# Patient Record
Sex: Male | Born: 1955 | Race: White | Hispanic: No | Marital: Married | Smoking: Never smoker
Health system: Southern US, Community
[De-identification: ages and names within clinical notes are randomized; demographics above are authoritative.]

---

## 2006-12-19 ENCOUNTER — Encounter (INDEPENDENT_AMBULATORY_CARE_PROVIDER_SITE_OTHER): Payer: Self-pay | Admitting: Internal Medicine

## 2006-12-19 ENCOUNTER — Ambulatory Visit: Payer: Self-pay | Admitting: Family Medicine

## 2006-12-22 ENCOUNTER — Ambulatory Visit: Payer: Self-pay | Admitting: Internal Medicine

## 2006-12-26 ENCOUNTER — Ambulatory Visit: Payer: Self-pay | Admitting: Internal Medicine

## 2007-02-15 ENCOUNTER — Ambulatory Visit: Payer: Self-pay | Admitting: Family Medicine

## 2008-01-18 ENCOUNTER — Ambulatory Visit: Payer: Self-pay | Admitting: Cardiology

## 2008-01-18 ENCOUNTER — Encounter (INDEPENDENT_AMBULATORY_CARE_PROVIDER_SITE_OTHER): Payer: Self-pay | Admitting: Internal Medicine

## 2008-01-18 ENCOUNTER — Emergency Department (HOSPITAL_COMMUNITY): Admission: EM | Admit: 2008-01-18 | Discharge: 2008-01-18 | Payer: Self-pay | Admitting: Emergency Medicine

## 2008-01-18 ENCOUNTER — Ambulatory Visit: Payer: Self-pay | Admitting: Family Medicine

## 2008-01-18 DIAGNOSIS — F528 Other sexual dysfunction not due to a substance or known physiological condition: Secondary | ICD-10-CM

## 2008-01-18 DIAGNOSIS — R079 Chest pain, unspecified: Secondary | ICD-10-CM

## 2008-01-18 DIAGNOSIS — E162 Hypoglycemia, unspecified: Secondary | ICD-10-CM

## 2008-01-18 DIAGNOSIS — E782 Mixed hyperlipidemia: Secondary | ICD-10-CM | POA: Insufficient documentation

## 2010-07-20 ENCOUNTER — Encounter (INDEPENDENT_AMBULATORY_CARE_PROVIDER_SITE_OTHER): Payer: Self-pay | Admitting: *Deleted

## 2011-01-12 NOTE — Letter (Signed)
Summary: Nadara Eaton letter  [facility]  [address] [place], [place] [address]   Phone: [phone]  Fax: [phone]       07/20/2010 MRN: [id number]  [facility] [address] [place], [place]  [address]  Dear Mr. Kolenovic,  [facility] [place] [place] announce the retirement of Arta Silence, M.D., from full-time practice at the [facility] office effective June 11, 2010 and his plans of returning part-time.  It is important to Dr. Hetty Ely and to our practice that you understand that [facility] has seven physicians in our office for your health care needs.  We will continue to offer the same exceptional care that you have today.    Dr. Hetty Ely has spoken to many of you about his plans for retirement and returning part-time in the fall.   We will continue to work with you through the transition to schedule appointments for you in the office and meet the high standards that [facility] is committed to.   Again, it is with great pleasure that we share the news that Dr. Hetty Ely will return to [facility] in October of 2011 with a reduced schedule.    If you have any questions, or would like to request an appointment with one of our physicians, please call us at [phone] and press the option for Scheduling an appointment.  We take pleasure in providing you with excellent patient care and look forward to seeing you at your next office visit.  Our [facility] Physicians are:  Tillman Abide, M.D. Laurita Quint, M.D. Roxy Manns, M.D. Kerby Nora, M.D. Hannah Beat, M.D. Ruthe Mannan, M.D. We proudly welcomed Raechel Ache, M.D. and Eustaquio Boyden, M.D. to the practice in July/August 2011.  Sincerely,  [facility]

## 2011-04-27 NOTE — Consult Note (Signed)
Jerry Daniels, Jerry Daniels              ACCOUNT NO.:  [id number]   MEDICAL RECORD NO.:  [id number]          PATIENT TYPE:  EMS   LOCATION:  [facility]                         FACILITY:  [facility]   PHYSICIAN:  Luis Abed, MD, FACCDATE OF BIRTH:  [birth year]   DATE OF CONSULTATION:  01/18/2008  DATE OF DISCHARGE:                                 CONSULTATION   PRIORITY EMERGENCY ROOM CONSULTATION   Mr. Carico is a very pleasant gentleman, who has had some marked  dizziness today.  He has no known prior cardiac disease.  He is not on  any medications.  He saw Everrett Coombe, Nurse Practitioner, at [facility] today and she called for [place] to help with further evaluation.  The  patient mentions that he had some slight chest discomfort that was in  his chest and into his back briefly on one or two occasions in the past  several weeks.  These have occurred at rest.  Independent of this, today  he felt dizziness.  This bothered him with any type of motion.  He also  had a sensation of flushing slightly with this and he looked slightly  diaphoretic and felt a little clammy.  He was not unstable at any point.  This constellation of symptoms was concerning and he was brought from  the [facility] office to the emergency room at [facility].  Here at  first, he did have dizziness.  This then began to improve.  Ultimately,  he was feeling better.  I also gave him 25 mg of Meclizine and he  continued to improve.   It is of note that his blood pressure is mildly elevated.  It is also of  note that his glucose was checked during the day today.  It was reported  that it was 29 when checked with his father's glucose meter.  Other  family members were then checked and were said to be in the normal  range.  It is still difficult to document for sure whether he was truly  hypoglycemic.   PAST MEDICAL HISTORY:   ALLERGIES:  No known drug allergies.   MEDICATIONS:  On no medications.   OTHER MEDICAL PROBLEMS:   See the list below.   SOCIAL HISTORY:  The patient is married.  He smoked for 30 years and  then stopped five years ago.  He lives in [place].  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   FAMILY HISTORY:  There is a family history of coronary disease.   REVIEW OF SYSTEMS:  As mentioned, he had some dizziness and sweats.  He  had no major headache.  He has had no skin rashes.  He did have slight  chest discomfort on a few occasions at rest.  He has had no major GI or  GU symptoms.  Otherwise, his review of systems is negative.   PHYSICAL EXAMINATION:  GENERAL:  The patient is here with his wife.  He  is oriented to person, time, and place.  Affect is normal.  VITAL SIGNS:  His blood pressure is 149/90, respirations  18, pulse is  60.  O2 SAT is 98% on room air.  HEENT:  No xanthelasma.  He has normal extraocular motion.  NECK:  There are no carotid bruits.  There is no jugulovenous  distention.  LUNGS:  Clear.  Respiratory effort is not labored.  CARDIAC:  An S1 with an S2.  There are no clicks or significant murmurs.  ABDOMEN:  Soft.  He has no masses or bruits.  He has normal bowel  sounds.  EXTREMITIES:  There is no significant peripheral edema.   EKG reveals sinus bradycardia.  Labs done in the emergency room reveal a  hemoglobin of 16.7 with a white count 6700 and platelets 305,000.  BUN  is 16 with a creatinine 1.13.  Glucose is 99 and potassium is 4.4.  Troponin is less than 0.01 and a point-of-care troponin is also less  than 0.05.  Calcium is normal.   PROBLEM LIST:  1. Borderline hypertension.  This will need to be followed up.  2. Question of a glucose of 29 today.  This is not completely      documented but will need to be kept in mind.  3. Some vague chest discomfort in the past few weeks.  I will arrange      for followup with me concerning outpatient Myoview scanning.  4. Episode today of marked dizziness, which now appears to be      improving.  I  suspect that this is some type of vertigo.  He seems      better with Meclizine.  His labs are stable.  He is being allowed      to go home with his wife driving, and he will take it easy tomorrow      and take more Meclizine during the day tomorrow.  I will be in      contact with Billie Bean to arrange to see the patient in followup      to review the issue as to whether or not he in fact has some      unusual type of reactive hypoglycemia, but I doubt that this is the      case.  His cardiac status is stable, and he is being discharged      from the emergency room.      Luis Abed, MD, [facility]  Electronically Signed     JDK/MEDQ  D:  01/18/2008  T:  01/18/2008  Job:  [id number]   cc:   Willaim Sheng D. Jillyn Hidden, FNP  Luis Abed, MD, [facility]

## 2011-09-03 LAB — POCT CARDIAC MARKERS
Myoglobin, poc: 40.8
Operator id: 146091
Troponin i, poc: <0.05

## 2011-09-03 LAB — DIFFERENTIAL
Basophils Absolute: 0
Basophils Relative: 1
Eosinophils Relative: 5
Lymphocytes Relative: 27
Neutro Abs: 4

## 2011-09-03 LAB — BASIC METABOLIC PANEL
Calcium: 9.3
Creatinine, Ser: 1.13
GFR calc Af Amer: >60
GFR calc non Af Amer: >60
Sodium: 140

## 2011-09-03 LAB — CK TOTAL AND CKMB (NOT AT ARMC)
CK, MB: 2.1
Relative Index: 1.5
Total CK: 144

## 2011-09-03 LAB — CBC
Hemoglobin: 16.7
RBC: 5.32
RDW: 13.2

## 2013-06-20 ENCOUNTER — Other Ambulatory Visit: Payer: Self-pay | Admitting: Nurse Practitioner

## 2013-06-20 LAB — CK-MB: CK-MB: 0.9 ng/mL (ref 0.5–3.6)

## 2014-05-31 ENCOUNTER — Ambulatory Visit: Payer: Self-pay | Admitting: Family Medicine

## 2015-02-04 ENCOUNTER — Ambulatory Visit: Payer: Self-pay | Admitting: Gastroenterology

## 2015-04-07 LAB — SURGICAL PATHOLOGY

## 2020-08-25 ENCOUNTER — Encounter: Payer: Self-pay | Admitting: Emergency Medicine

## 2020-08-25 ENCOUNTER — Emergency Department: Payer: 59

## 2020-08-25 ENCOUNTER — Emergency Department
Admission: EM | Admit: 2020-08-25 | Discharge: 2020-08-25 | Disposition: A | Discharge status: Home / Self Care | Payer: 59 | Attending: Emergency Medicine | Admitting: Emergency Medicine

## 2020-08-25 ENCOUNTER — Other Ambulatory Visit: Payer: Self-pay

## 2020-08-25 DIAGNOSIS — W19XXXA Unspecified fall, initial encounter: Secondary | ICD-10-CM

## 2020-08-25 DIAGNOSIS — S0101XA Laceration without foreign body of scalp, initial encounter: Secondary | ICD-10-CM | POA: Insufficient documentation

## 2020-08-25 DIAGNOSIS — W01198A Fall on same level from slipping, tripping and stumbling with subsequent striking against other object, initial encounter: Secondary | ICD-10-CM | POA: Diagnosis not present

## 2020-08-25 DIAGNOSIS — S01511A Laceration without foreign body of lip, initial encounter: Secondary | ICD-10-CM | POA: Diagnosis not present

## 2020-08-25 DIAGNOSIS — Y999 Unspecified external cause status: Secondary | ICD-10-CM | POA: Diagnosis not present

## 2020-08-25 DIAGNOSIS — Y939 Activity, unspecified: Secondary | ICD-10-CM | POA: Diagnosis not present

## 2020-08-25 DIAGNOSIS — Y929 Unspecified place or not applicable: Secondary | ICD-10-CM | POA: Diagnosis not present

## 2020-08-25 MED ORDER — LIDOCAINE-EPINEPHRINE 2 %-1:100000 IJ SOLN
20.0000 mL | Freq: Once | INTRAMUSCULAR | Status: AC
  Administered 2020-08-25: 20 mL
  Filled 2020-08-25: qty 1

## 2020-08-25 MED ORDER — OXYCODONE-ACETAMINOPHEN 5-325 MG PO TABS
1.0000 | ORAL_TABLET | Freq: Once | ORAL | Status: AC
  Administered 2020-08-25: 1 via ORAL
  Filled 2020-08-25: qty 1

## 2020-08-25 NOTE — ED Notes (Signed)
EDP at bedside to perform laceration repair.

## 2020-08-25 NOTE — ED Notes (Signed)
Pt's family updated multiple times over last hour by various staff.

## 2020-08-25 NOTE — ED Notes (Signed)
NAD noted at time of D/C. Pt taken to lobby via wheelchair at this time by his daughter. Pt denies comments/concerns regarding D/C instructions. Bandage applied by this RN prior to discharge.

## 2020-08-25 NOTE — ED Provider Notes (Signed)
[facility] Emergency Department Provider Note   ____________________________________________   First MD Initiated Contact with Patient 08/25/20 [phone]     (approximate)  I have reviewed the triage vital signs and the nursing notes.   HISTORY  Chief Complaint Fall    HPI Jerry Daniels is a 64 y.o. male with past medical history of hyperlipidemia who presents to the ED following fall.  Patient reports that he turned around while on the stairs and lost his balance, causing him to fall down 15-20 steps and strike his head on the railing.  He denies losing consciousness, but has had significant headache and neck pain since the fall.  He has large L-shaped laceration to his frontal scalp as well has laceration to his lower lip.  He denies any vision changes, speech changes, numbness or weakness.  He is not anticoagulated.  He denies any chest pain, abdominal pain, or extremity pain.        History reviewed. No pertinent past medical history.  Patient Active Problem List   Diagnosis Date Noted  . HYPOGLYCEMIA 01/18/2008  . 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 ERECTILE DYSFUNCTION 01/18/2008  . CHEST PAIN 01/18/2008    History reviewed. No pertinent surgical history.  Prior to Admission medications   Not on File    Allergies Patient has no known allergies.  No family history on file.  Social History Social History   Tobacco Use  . Smoking status: Not on file  Substance Use Topics  . Alcohol use: Not on file  . Drug use: Not on file    Review of Systems  Constitutional: No fever/chills Eyes: No visual changes. ENT: No sore throat. Cardiovascular: Denies chest pain. Respiratory: Denies shortness of breath. Gastrointestinal: No abdominal pain.  No nausea, no vomiting.  No diarrhea.  No constipation. Genitourinary: Negative for dysuria. Musculoskeletal: Negative for back pain.  Positive for neck pain. Skin: Negative for rash.  Positive  for laceration. Neurological: Positive for for headaches, negative for focal weakness or numbness.  ____________________________________________   PHYSICAL EXAM:  VITAL SIGNS: ED Triage Vitals  Enc Vitals Group     BP 08/25/20 0155 129/77     Pulse Rate 08/25/20 0155 (!) 54     Resp 08/25/20 0155 16     Temp 08/25/20 0155 97.7 F (36.5 C)     Temp Source 08/25/20 0155 Oral     SpO2 08/25/20 0155 96 %     Weight 08/25/20 0156 215 lb (97.5 kg)     Height 08/25/20 0156 5\' 10"  (1.778 m)     Head Circumference --      Peak Flow --      Pain Score 08/25/20 0156 5     Pain Loc --      Pain Edu? --      Excl. in GC? --     Constitutional: Alert and oriented. Eyes: Conjunctivae are normal.  Pupils equal round and reactive to light bilaterally. Head: Large L shaped and approximately 10 cm laceration to left frontal scalp. Nose: No congestion/rhinnorhea. Mouth/Throat: Mucous membranes are moist.  1 cm superficial laceration to left lower lip. Neck: Normal ROM Cardiovascular: Normal rate, regular rhythm. Grossly normal heart sounds. Respiratory: Normal respiratory effort.  No retractions. Lungs CTAB. Gastrointestinal: Soft and nontender. No distention. Genitourinary: deferred Musculoskeletal: No lower extremity tenderness nor edema. Neurologic:  Normal speech and language. No gross focal neurologic deficits are appreciated. Skin:  Skin is warm, dry and intact. No rash  noted. Psychiatric: Mood and affect are normal. Speech and behavior are normal.  ____________________________________________   LABS (all labs ordered are listed, but only abnormal results are displayed)  Labs Reviewed - No data to display   PROCEDURES  Procedure(s) performed (including Critical Care):  Marland KitchenMarland KitchenLaceration Repair  Date/Time: 08/25/2020 9:22 AM Performed by: Chesley Noon, MD Authorized by: Chesley Noon, MD   Consent:    Consent obtained:  Verbal   Consent given by:  Patient   Risks  discussed:  Infection, pain, poor cosmetic result, need for additional repair and retained foreign body   Alternatives discussed:  No treatment Anesthesia (see MAR for exact dosages):    Anesthesia method:  Local infiltration   Local anesthetic:  Lidocaine 2% WITH epi Laceration details:    Location:  Scalp   Scalp location:  Frontal   Length (cm):  10 Repair type:    Repair type:  Intermediate Pre-procedure details:    Preparation:  Patient was prepped and draped in usual sterile fashion and imaging obtained to evaluate for foreign bodies Exploration:    Wound exploration: wound explored through full range of motion and entire depth of wound probed and visualized     Contaminated: no   Treatment:    Area cleansed with:  Saline   Amount of cleaning:  Standard   Irrigation solution:  Sterile saline   Irrigation method:  Pressure wash   Visualized foreign bodies/material removed: no   Skin repair:    Repair method:  Sutures   Suture size:  4-0   Suture material:  Nylon   Suture technique:  Retention suture and simple interrupted   Number of sutures:  13 Approximation:    Approximation:  Close Post-procedure details:    Dressing:  Antibiotic ointment   Patient tolerance of procedure:  Tolerated well, no immediate complications .Marland KitchenLaceration Repair  Date/Time: 08/25/2020 9:24 AM Performed by: Chesley Noon, MD Authorized by: Chesley Noon, MD   Consent:    Consent obtained:  Verbal   Consent given by:  Patient Anesthesia (see MAR for exact dosages):    Anesthesia method:  Local infiltration   Local anesthetic:  Lidocaine 2% WITH epi Laceration details:    Location:  Lip   Lip location:  Lower exterior lip   Length (cm):  1 Repair type:    Repair type:  Simple Pre-procedure details:    Preparation:  Patient was prepped and draped in usual sterile fashion and imaging obtained to evaluate for foreign bodies Exploration:    Wound exploration: wound explored through  full range of motion and entire depth of wound probed and visualized     Contaminated: no   Treatment:    Area cleansed with:  Saline   Amount of cleaning:  Standard   Irrigation solution:  Sterile saline   Irrigation method:  Pressure wash   Visualized foreign bodies/material removed: no   Skin repair:    Repair method:  Sutures   Suture size:  4-0   Suture material:  Prolene   Suture technique:  Simple interrupted   Number of sutures:  1 Approximation:    Approximation:  Loose   Vermilion border: well-aligned   Post-procedure details:    Dressing:  Open (no dressing)   Patient tolerance of procedure:  Tolerated well, no immediate complications     ____________________________________________   INITIAL IMPRESSION / ASSESSMENT AND PLAN / ED COURSE       64 year old male with past medical history of hyperlipidemia who presents  to the ED following fall down 15-20 steps with no LOC.  He is not anticoagulated and has no focal neurologic deficits on exam.  CT head and C-spine are negative for acute process, maxillofacial CT shows no evidence of acute facial bony fracture.  Lacerations to his left frontal scalp as well as left lip were repaired without difficulty.  No evidence of significant trauma to his trunk or extremities.  Patient is appropriate for discharge home and was counseled to have sutures removed in approximately 1 week.  Patient and daughter agree with plan.      ____________________________________________   FINAL CLINICAL IMPRESSION(S) / ED DIAGNOSES  Final diagnoses:  Laceration of scalp, initial encounter  Fall, initial encounter     ED Discharge Orders    None       Note:  This document was prepared using Dragon voice recognition software and may include unintentional dictation errors.   Chesley Noon, MD 08/25/20 [phone]

## 2020-08-25 NOTE — ED Triage Notes (Signed)
Pt states fell head first down 20 steps tonight. Pt denies loc. Pt deneis vomiting, nausea, perrl 71mm and brisk. Pt with large "L" shaped laceration with exposed bone noted above left eyebrow extending down to eyelid corner. Pt rigid c collared in triage. Cms intact in extremities.

## 2020-08-25 NOTE — ED Notes (Signed)
Pt removed c collar, states he cannot wear it. Pt informed of risks including possible paralysis if neck is broken, pt declines to wear collar.

## 2021-10-13 IMAGING — CT CT CERVICAL SPINE W/O CM
3 of 4 series · 9 of 33 positions shown, 11 images · non-contrast
Comparison: None.

CLINICAL DATA: Fell headfirst down 20 steps laceration with bone
exposed

EXAM:
CT HEAD WITHOUT CONTRAST
CT MAXILLOFACIAL WITHOUT CONTRAST
CT CERVICAL SPINE WITHOUT CONTRAST
TECHNIQUE: Multidetector CT imaging of the head, cervical spine, and
maxillofacial structures were performed using the standard protocol
without intravenous contrast. Multiplanar CT image reconstructions
of the cervical spine and maxillofacial structures were also
generated.

[Series 5: sagittal bone · sagittal · 0.23mm/px · 5 of 59 slices shown, 6 images]
[im 20/59  bone]
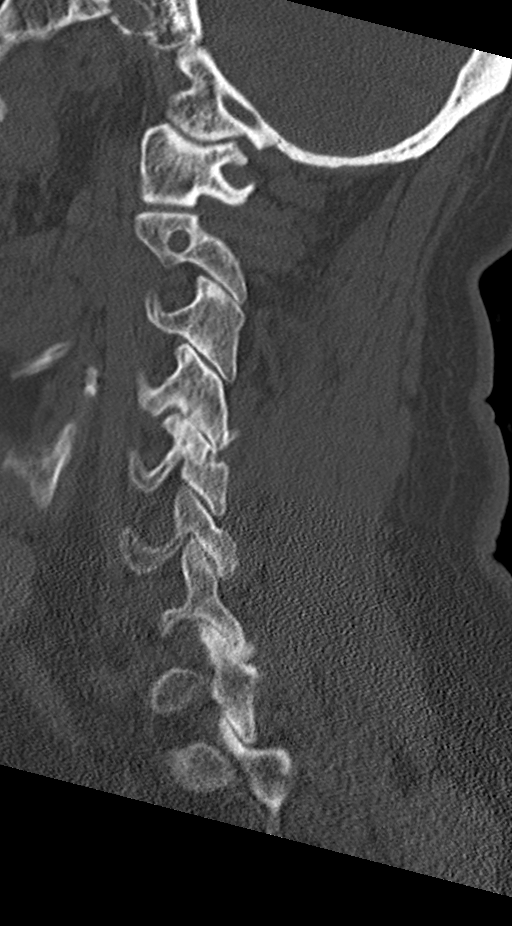
[im 25/59  bone]
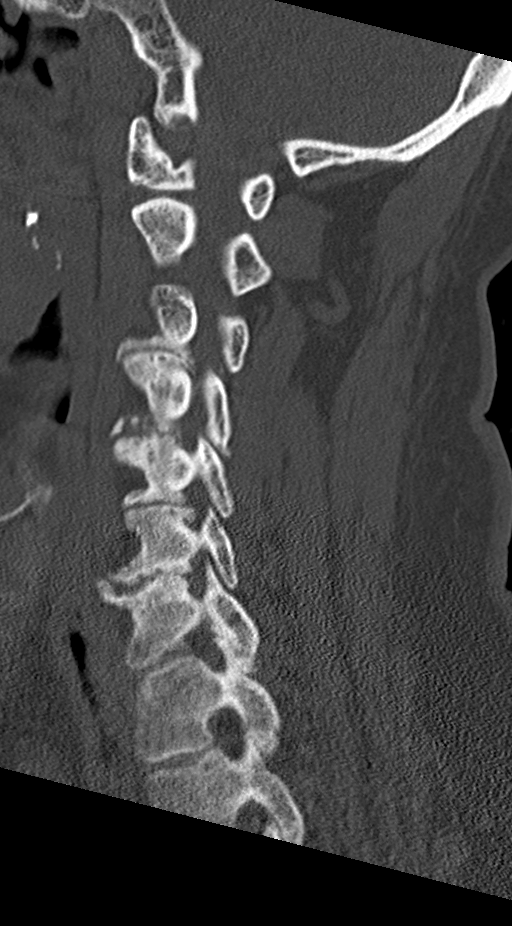
[im 30/59  soft-tissue]
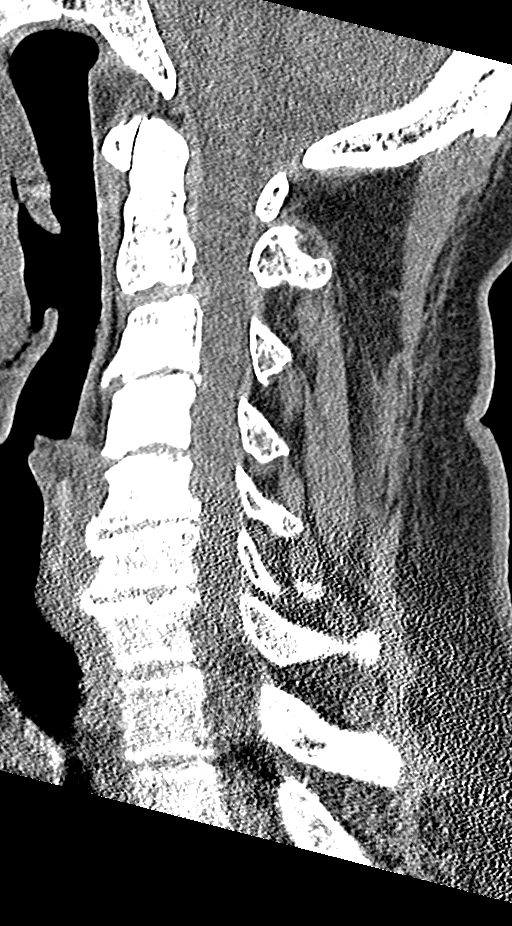
[im 30/59  bone]
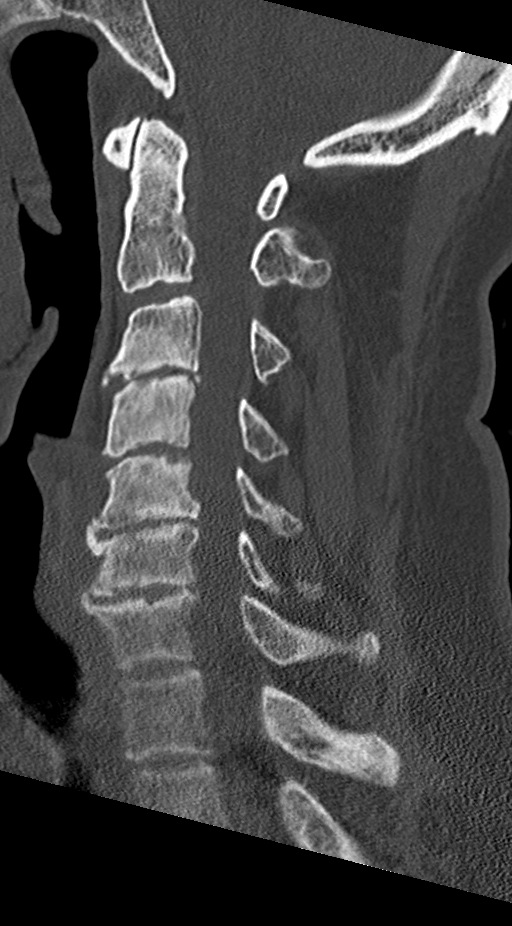
[im 34/59  bone]
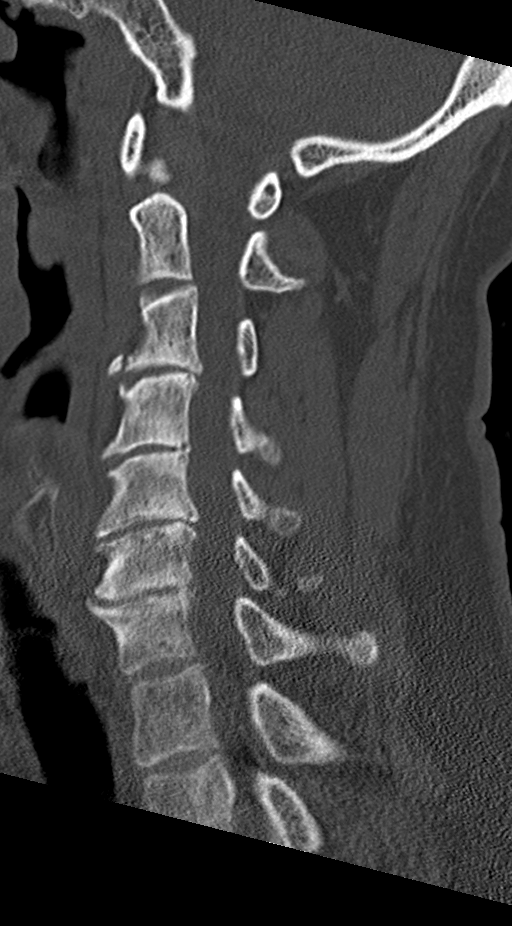
[im 39/59  bone]
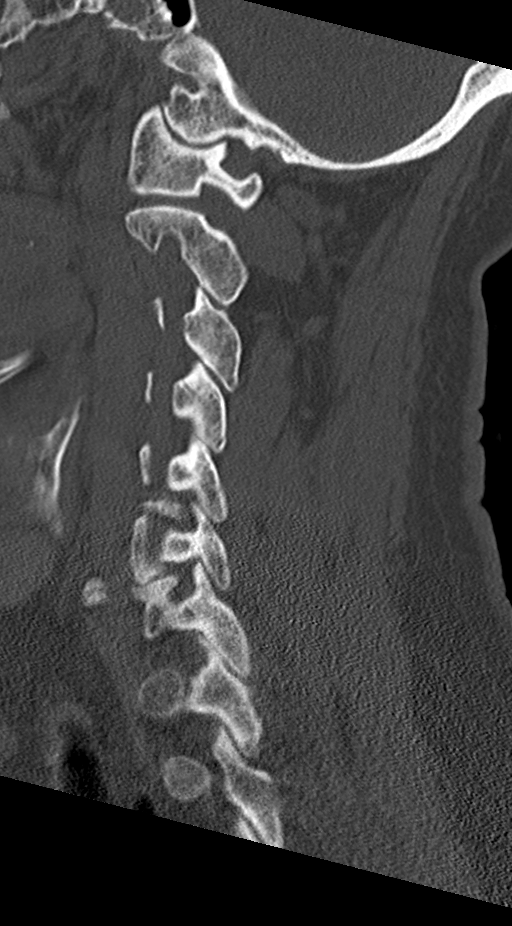

[Series 6: coronal bone · coronal · 0.25mm/px · 3 of 54 slices shown]
[im 11/54  bone]
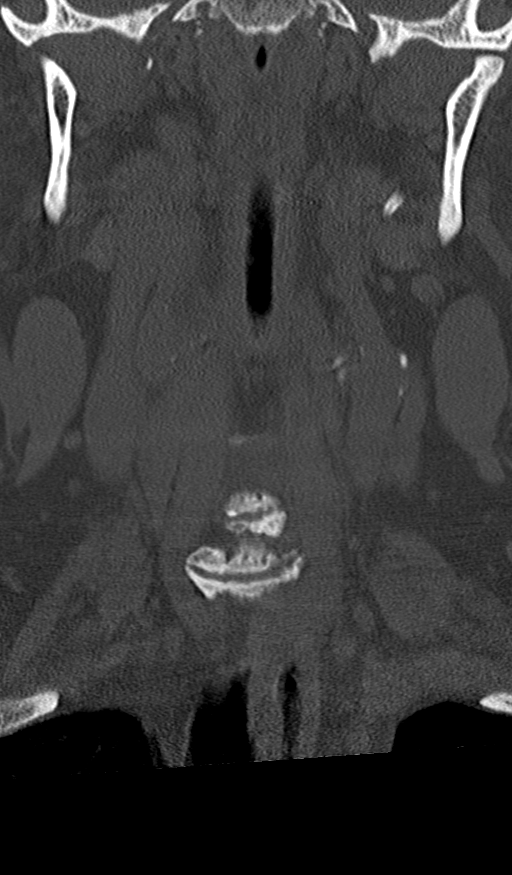
[im 22/54  bone]
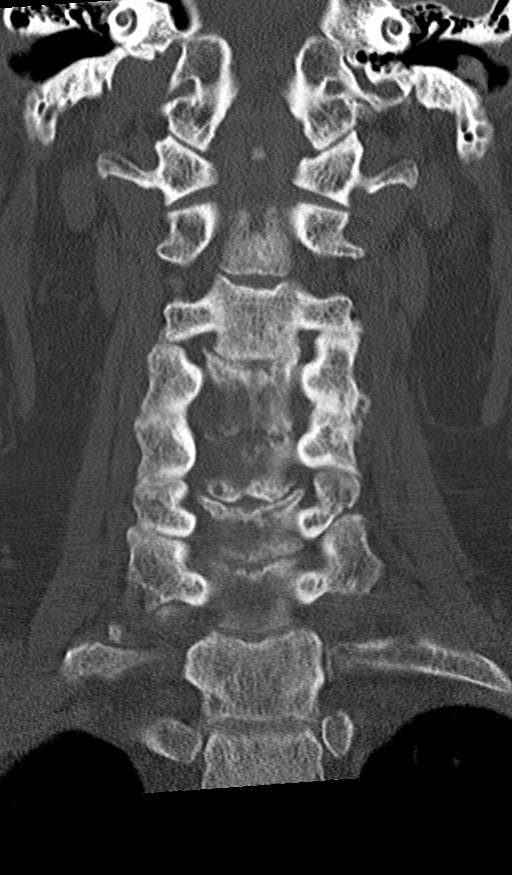
[im 32/54  bone]
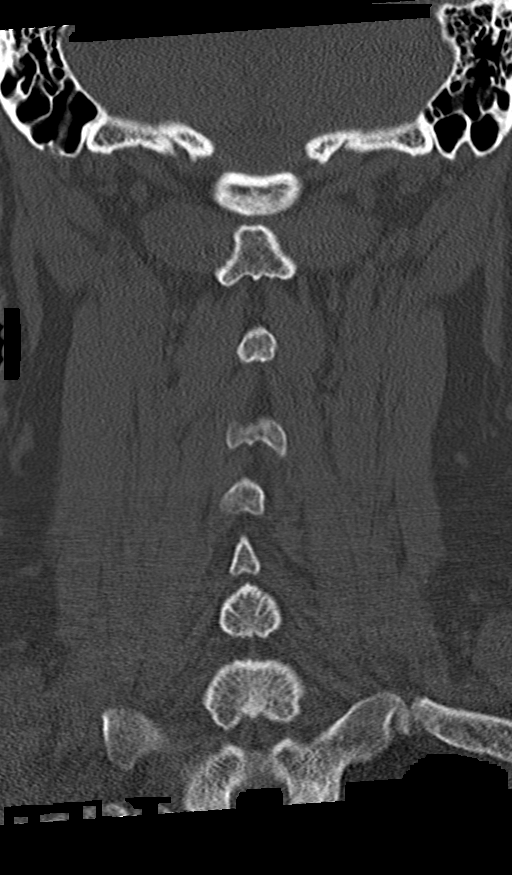

[Series 7: orthogonal axials · axial · 0.21mm/px · z∈[-241,-241]mm · 1 of 109 slices shown, 2 images]
[im 55/109  soft-tissue]
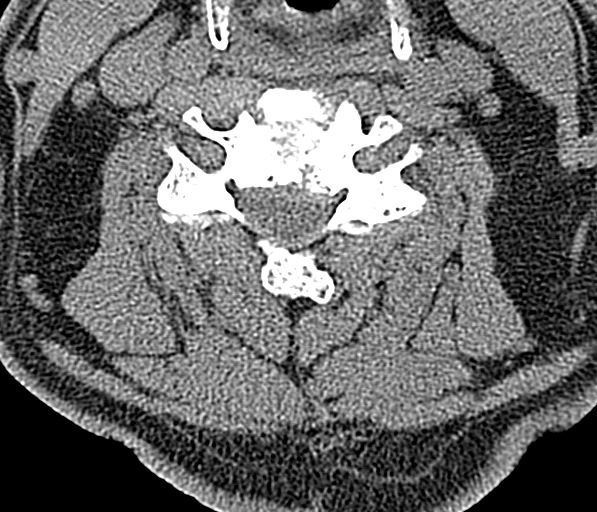
[im 55/109  bone]
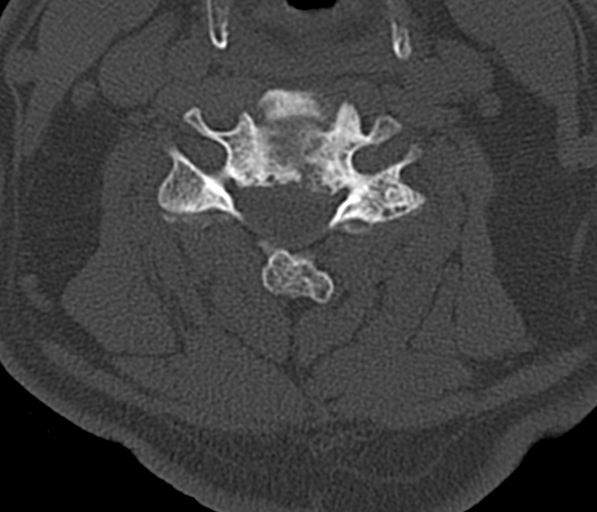

[9 of 33 positions shown; findings below may reference images not displayed]

FINDINGS: CT HEAD FINDINGS

Brain: No acute territorial infarction, hemorrhage, or intracranial
mass. The ventricles are nonenlarged. There is mild atrophy.

Vascular: No hyperdense vessels. Scattered carotid vascular
calcification.

Skull: No depressed skull fracture

Other: D left forehead and periorbital laceration

CT MAXILLOFACIAL FINDINGS

Osseous: Mandibular heads are normally position. No mandibular
fracture. Mastoid air cells are clear. Zygomatic arches are intact.
Pterygoid plates are intact. Chronic left nasal bone deformity with
fixating screws.

Orbits: Old left orbital floor fracture with surgical mesh. No acute
orbital fracture is seen. The globes appear intact.

Sinuses: Chronic fracture deformities of the left maxillary sinus
with surgical plates and multiple fixating screws. Surgical plate
and fixating screws over the frontal sinus on the left side. No
acute fluid level. Mild mucosal thickening in the ethmoid and
maxillary sinuses.

Soft tissues: Deep left forehead laceration with periorbital soft
tissue swelling.

CT CERVICAL SPINE FINDINGS

Alignment: Straightening of the cervical spine. No subluxation.
Facet alignment is maintained.

Skull base and vertebrae: No acute fracture. No primary bone lesion
or focal pathologic process.

Soft tissues and spinal canal: No prevertebral fluid or swelling. No
visible canal hematoma.

Disc levels: Moderate to severe diffuse degenerative changes from C3
through C7 with multiple level disc space narrowing, endplate
changes and osteophyte. Facet degenerative changes at multiple
levels with foraminal narrowing.

Upper chest: Negative.

Other: None
IMPRESSION: 1. No CT evidence for acute intracranial abnormality. Atrophy.
2. Deep left forehead laceration and periorbital soft tissue
swelling. No acute facial bone fracture identified. Postsurgical and
posttraumatic deformities of the left orbit, left frontal and
maxillary sinuses, and left nasal bones.
3. Straightening of the cervical spine with degenerative changes. No
acute osseous abnormality.

## 2023-07-10 ENCOUNTER — Emergency Department (HOSPITAL_COMMUNITY): Payer: PPO

## 2023-07-10 ENCOUNTER — Encounter (HOSPITAL_COMMUNITY): Payer: Self-pay | Admitting: Emergency Medicine

## 2023-07-10 ENCOUNTER — Other Ambulatory Visit: Payer: Self-pay

## 2023-07-10 ENCOUNTER — Inpatient Hospital Stay (HOSPITAL_COMMUNITY)
Admission: EM | Admit: 2023-07-10 | Discharge: 2023-08-14 | DRG: 907 | Disposition: E | Payer: PPO | Attending: Surgery | Admitting: Surgery

## 2023-07-10 DIAGNOSIS — K567 Ileus, unspecified: Secondary | ICD-10-CM | POA: Diagnosis not present

## 2023-07-10 DIAGNOSIS — S32049A Unspecified fracture of fourth lumbar vertebra, initial encounter for closed fracture: Secondary | ICD-10-CM | POA: Diagnosis present

## 2023-07-10 DIAGNOSIS — Z66 Do not resuscitate: Secondary | ICD-10-CM | POA: Diagnosis present

## 2023-07-10 DIAGNOSIS — E782 Mixed hyperlipidemia: Secondary | ICD-10-CM | POA: Diagnosis present

## 2023-07-10 DIAGNOSIS — R34 Anuria and oliguria: Secondary | ICD-10-CM | POA: Diagnosis not present

## 2023-07-10 DIAGNOSIS — J690 Pneumonitis due to inhalation of food and vomit: Secondary | ICD-10-CM | POA: Diagnosis not present

## 2023-07-10 DIAGNOSIS — E669 Obesity, unspecified: Secondary | ICD-10-CM | POA: Diagnosis present

## 2023-07-10 DIAGNOSIS — K559 Vascular disorder of intestine, unspecified: Secondary | ICD-10-CM | POA: Diagnosis present

## 2023-07-10 DIAGNOSIS — F10139 Alcohol abuse with withdrawal, unspecified: Secondary | ICD-10-CM | POA: Diagnosis not present

## 2023-07-10 DIAGNOSIS — E875 Hyperkalemia: Secondary | ICD-10-CM | POA: Diagnosis not present

## 2023-07-10 DIAGNOSIS — Y907 Blood alcohol level of 200-239 mg/100 ml: Secondary | ICD-10-CM | POA: Diagnosis present

## 2023-07-10 DIAGNOSIS — K55049 Acute infarction of large intestine, extent unspecified: Secondary | ICD-10-CM

## 2023-07-10 DIAGNOSIS — K7689 Other specified diseases of liver: Secondary | ICD-10-CM | POA: Diagnosis present

## 2023-07-10 DIAGNOSIS — T07XXXA Unspecified multiple injuries, initial encounter: Secondary | ICD-10-CM | POA: Diagnosis not present

## 2023-07-10 DIAGNOSIS — Z515 Encounter for palliative care: Secondary | ICD-10-CM | POA: Diagnosis not present

## 2023-07-10 DIAGNOSIS — A419 Sepsis, unspecified organism: Secondary | ICD-10-CM | POA: Diagnosis not present

## 2023-07-10 DIAGNOSIS — N179 Acute kidney failure, unspecified: Secondary | ICD-10-CM

## 2023-07-10 DIAGNOSIS — Z7189 Other specified counseling: Secondary | ICD-10-CM | POA: Diagnosis not present

## 2023-07-10 DIAGNOSIS — E8770 Fluid overload, unspecified: Secondary | ICD-10-CM | POA: Diagnosis present

## 2023-07-10 DIAGNOSIS — R079 Chest pain, unspecified: Secondary | ICD-10-CM | POA: Diagnosis not present

## 2023-07-10 DIAGNOSIS — Z781 Physical restraint status: Secondary | ICD-10-CM

## 2023-07-10 DIAGNOSIS — I10 Essential (primary) hypertension: Secondary | ICD-10-CM | POA: Diagnosis present

## 2023-07-10 DIAGNOSIS — G9341 Metabolic encephalopathy: Secondary | ICD-10-CM | POA: Diagnosis present

## 2023-07-10 DIAGNOSIS — J80 Acute respiratory distress syndrome: Secondary | ICD-10-CM | POA: Diagnosis not present

## 2023-07-10 DIAGNOSIS — Z91148 Patient's other noncompliance with medication regimen for other reason: Secondary | ICD-10-CM

## 2023-07-10 DIAGNOSIS — T79A3XA Traumatic compartment syndrome of abdomen, initial encounter: Secondary | ICD-10-CM | POA: Diagnosis not present

## 2023-07-10 DIAGNOSIS — S2509XA Other specified injury of thoracic aorta, initial encounter: Secondary | ICD-10-CM | POA: Diagnosis present

## 2023-07-10 DIAGNOSIS — E8809 Other disorders of plasma-protein metabolism, not elsewhere classified: Secondary | ICD-10-CM | POA: Diagnosis present

## 2023-07-10 DIAGNOSIS — K029 Dental caries, unspecified: Secondary | ICD-10-CM | POA: Diagnosis present

## 2023-07-10 DIAGNOSIS — K661 Hemoperitoneum: Secondary | ICD-10-CM | POA: Diagnosis present

## 2023-07-10 DIAGNOSIS — M79A3 Nontraumatic compartment syndrome of abdomen: Secondary | ICD-10-CM | POA: Diagnosis present

## 2023-07-10 DIAGNOSIS — R6521 Severe sepsis with septic shock: Secondary | ICD-10-CM | POA: Diagnosis not present

## 2023-07-10 DIAGNOSIS — S2243XA Multiple fractures of ribs, bilateral, initial encounter for closed fracture: Secondary | ICD-10-CM | POA: Diagnosis present

## 2023-07-10 DIAGNOSIS — E162 Hypoglycemia, unspecified: Secondary | ICD-10-CM | POA: Diagnosis not present

## 2023-07-10 DIAGNOSIS — J9811 Atelectasis: Secondary | ICD-10-CM | POA: Diagnosis present

## 2023-07-10 DIAGNOSIS — Z6835 Body mass index (BMI) 35.0-35.9, adult: Secondary | ICD-10-CM

## 2023-07-10 DIAGNOSIS — S2500XA Unspecified injury of thoracic aorta, initial encounter: Secondary | ICD-10-CM | POA: Diagnosis present

## 2023-07-10 LAB — PROTIME-INR
INR: 1.3 — ABNORMAL HIGH (ref 0.8–1.2)
Prothrombin Time: 16.5 seconds — ABNORMAL HIGH (ref 11.4–15.2)

## 2023-07-10 LAB — I-STAT CHEM 8, ED
BUN: 18 mg/dL (ref 8–23)
Calcium, Ion: 1.16 mmol/L (ref 1.15–1.40)
Chloride: 104 mmol/L (ref 98–111)
Creatinine, Ser: 1.9 mg/dL — ABNORMAL HIGH (ref 0.61–1.24)
Glucose, Bld: 136 mg/dL — ABNORMAL HIGH (ref 70–99)
HCT: 42 % (ref 39.0–52.0)
Hemoglobin: 14.3 g/dL (ref 13.0–17.0)
Potassium: 3.6 mmol/L (ref 3.5–5.1)
Sodium: 139 mmol/L (ref 135–145)
TCO2: 21 mmol/L — ABNORMAL LOW (ref 22–32)

## 2023-07-10 LAB — COMPREHENSIVE METABOLIC PANEL
ALT: 91 U/L — ABNORMAL HIGH (ref 0–44)
AST: 133 U/L — ABNORMAL HIGH (ref 15–41)
Albumin: 3.6 g/dL (ref 3.5–5.0)
Alkaline Phosphatase: 74 U/L (ref 38–126)
Anion gap: 13 (ref 5–15)
BUN: 16 mg/dL (ref 8–23)
CO2: 21 mmol/L — ABNORMAL LOW (ref 22–32)
Calcium: 8.9 mg/dL (ref 8.9–10.3)
Chloride: 102 mmol/L (ref 98–111)
Creatinine, Ser: 1.65 mg/dL — ABNORMAL HIGH (ref 0.61–1.24)
GFR, Estimated: 46 mL/min — ABNORMAL LOW (ref >60)
Glucose, Bld: 142 mg/dL — ABNORMAL HIGH (ref 70–99)
Potassium: 3.5 mmol/L (ref 3.5–5.1)
Sodium: 136 mmol/L (ref 135–145)
Total Bilirubin: 0.8 mg/dL (ref 0.3–1.2)
Total Protein: 6.8 g/dL (ref 6.5–8.1)

## 2023-07-10 LAB — URINALYSIS, ROUTINE W REFLEX MICROSCOPIC
Bilirubin Urine: NEGATIVE
Glucose, UA: NEGATIVE mg/dL
Hgb urine dipstick: NEGATIVE
Ketones, ur: NEGATIVE mg/dL
Nitrite: NEGATIVE
Protein, ur: 30 mg/dL — AB
Specific Gravity, Urine: 1.038 — ABNORMAL HIGH (ref 1.005–1.030)
pH: 5 (ref 5.0–8.0)

## 2023-07-10 LAB — CBC
HCT: 42.6 % (ref 39.0–52.0)
HCT: 40.4 % (ref 39.0–52.0)
Hemoglobin: 14.5 g/dL (ref 13.0–17.0)
Hemoglobin: 13.8 g/dL (ref 13.0–17.0)
MCH: 32.9 pg (ref 26.0–34.0)
MCH: 33.3 pg (ref 26.0–34.0)
MCHC: 34 g/dL (ref 30.0–36.0)
MCHC: 34.2 g/dL (ref 30.0–36.0)
MCV: 96.6 fL (ref 80.0–100.0)
MCV: 97.6 fL (ref 80.0–100.0)
Platelets: 347 10*3/uL (ref 150–400)
Platelets: 280 10*3/uL (ref 150–400)
RBC: 4.41 MIL/uL (ref 4.22–5.81)
RBC: 4.14 MIL/uL — ABNORMAL LOW (ref 4.22–5.81)
RDW: 13.2 % (ref 11.5–15.5)
RDW: 13.4 % (ref 11.5–15.5)
WBC: 16.3 10*3/uL — ABNORMAL HIGH (ref 4.0–10.5)
WBC: 22.6 10*3/uL — ABNORMAL HIGH (ref 4.0–10.5)
nRBC: 0 % (ref 0.0–0.2)
nRBC: 0 % (ref 0.0–0.2)

## 2023-07-10 LAB — SAMPLE TO BLOOD BANK

## 2023-07-10 LAB — BASIC METABOLIC PANEL
Anion gap: 13 (ref 5–15)
BUN: 17 mg/dL (ref 8–23)
CO2: 18 mmol/L — ABNORMAL LOW (ref 22–32)
Calcium: 8 mg/dL — ABNORMAL LOW (ref 8.9–10.3)
Chloride: 105 mmol/L (ref 98–111)
Creatinine, Ser: 1.49 mg/dL — ABNORMAL HIGH (ref 0.61–1.24)
GFR, Estimated: 51 mL/min — ABNORMAL LOW (ref >60)
Glucose, Bld: 133 mg/dL — ABNORMAL HIGH (ref 70–99)
Potassium: 3.8 mmol/L (ref 3.5–5.1)
Sodium: 136 mmol/L (ref 135–145)

## 2023-07-10 LAB — I-STAT CG4 LACTIC ACID, ED: Lactic Acid, Venous: 2.8 mmol/L (ref 0.5–1.9)

## 2023-07-10 LAB — MRSA NEXT GEN BY PCR, NASAL: MRSA by PCR Next Gen: NOT DETECTED

## 2023-07-10 LAB — HIV ANTIBODY (ROUTINE TESTING W REFLEX): HIV Screen 4th Generation wRfx: NONREACTIVE

## 2023-07-10 LAB — ETHANOL: Alcohol, Ethyl (B): 211 mg/dL — ABNORMAL HIGH (ref <10)

## 2023-07-10 MED ORDER — MELATONIN 3 MG PO TABS: 3.0000 mg | ORAL_TABLET | Freq: Every evening | ORAL | Status: DC | PRN

## 2023-07-10 MED ORDER — ONDANSETRON 4 MG PO TBDP: 4.0000 mg | ORAL_TABLET | Freq: Four times a day (QID) | ORAL | Status: DC | PRN

## 2023-07-10 MED ORDER — ONDANSETRON HCL 4 MG/2ML IJ SOLN
4.0000 mg | Freq: Four times a day (QID) | INTRAMUSCULAR | Status: DC | PRN
  Administered 2023-07-10: 4 mg via INTRAVENOUS
  Filled 2023-07-10: qty 2

## 2023-07-10 MED ORDER — FENTANYL CITRATE PF 50 MCG/ML IJ SOSY
50.0000 ug | PREFILLED_SYRINGE | Freq: Once | INTRAMUSCULAR | Status: AC
  Administered 2023-07-10: 50 ug via INTRAVENOUS
  Filled 2023-07-10: qty 1

## 2023-07-10 MED ORDER — OXYCODONE HCL 5 MG PO TABS
10.0000 mg | ORAL_TABLET | ORAL | Status: DC | PRN
  Administered 2023-07-10 – 2023-07-11 (×4): 10 mg via ORAL
  Filled 2023-07-10 (×4): qty 2

## 2023-07-10 MED ORDER — HYDRALAZINE HCL 20 MG/ML IJ SOLN: 10.0000 mg | INTRAMUSCULAR | Status: DC | PRN

## 2023-07-10 MED ORDER — IOHEXOL 350 MG/ML SOLN
75.0000 mL | Freq: Once | INTRAVENOUS | Status: AC | PRN
  Administered 2023-07-10: 75 mL via INTRAVENOUS

## 2023-07-10 MED ORDER — THIAMINE HCL 100 MG/ML IJ SOLN
100.0000 mg | Freq: Every day | INTRAMUSCULAR | Status: DC
  Administered 2023-07-10: 100 mg via INTRAVENOUS
  Filled 2023-07-10: qty 2

## 2023-07-10 MED ORDER — DOCUSATE SODIUM 100 MG PO CAPS
100.0000 mg | ORAL_CAPSULE | Freq: Two times a day (BID) | ORAL | Status: DC
  Administered 2023-07-10 – 2023-07-11 (×3): 100 mg via ORAL
  Filled 2023-07-10 (×4): qty 1

## 2023-07-10 MED ORDER — GABAPENTIN 300 MG PO CAPS
300.0000 mg | ORAL_CAPSULE | Freq: Three times a day (TID) | ORAL | Status: DC
  Administered 2023-07-10 – 2023-07-11 (×5): 300 mg via ORAL
  Filled 2023-07-10 (×6): qty 1

## 2023-07-10 MED ORDER — SODIUM CHLORIDE 0.9 % IV SOLN
250.0000 mL | INTRAVENOUS | Status: DC
  Administered 2023-07-13: 250 mL via INTRAVENOUS

## 2023-07-10 MED ORDER — LORAZEPAM 2 MG/ML IJ SOLN: 1.0000 mg | INTRAMUSCULAR | Status: DC | PRN

## 2023-07-10 MED ORDER — THIAMINE MONONITRATE 100 MG PO TABS
100.0000 mg | ORAL_TABLET | Freq: Every day | ORAL | Status: DC
  Administered 2023-07-11 – 2023-07-12 (×2): 100 mg via ORAL
  Filled 2023-07-10 (×2): qty 1

## 2023-07-10 MED ORDER — OXYCODONE HCL 5 MG PO TABS
5.0000 mg | ORAL_TABLET | ORAL | Status: DC | PRN
  Administered 2023-07-10 – 2023-07-12 (×3): 5 mg via ORAL
  Filled 2023-07-10 (×3): qty 1

## 2023-07-10 MED ORDER — ADULT MULTIVITAMIN W/MINERALS CH
1.0000 | ORAL_TABLET | Freq: Every day | ORAL | Status: DC
  Administered 2023-07-11 – 2023-07-14 (×4): 1 via ORAL
  Filled 2023-07-10 (×4): qty 1

## 2023-07-10 MED ORDER — SODIUM CHLORIDE 0.9 % IV SOLN: Freq: Once | INTRAVENOUS | Status: AC

## 2023-07-10 MED ORDER — SODIUM CHLORIDE 0.9 % IV SOLN: INTRAVENOUS | Status: DC

## 2023-07-10 MED ORDER — FOLIC ACID 1 MG PO TABS
1.0000 mg | ORAL_TABLET | Freq: Every day | ORAL | Status: DC
  Administered 2023-07-11 – 2023-07-12 (×2): 1 mg via ORAL
  Filled 2023-07-10 (×2): qty 1

## 2023-07-10 MED ORDER — METHOCARBAMOL 500 MG PO TABS
500.0000 mg | ORAL_TABLET | Freq: Three times a day (TID) | ORAL | Status: AC
  Administered 2023-07-10 – 2023-07-12 (×7): 500 mg via ORAL
  Filled 2023-07-10 (×8): qty 1

## 2023-07-10 MED ORDER — ENOXAPARIN SODIUM 30 MG/0.3ML IJ SOSY: 30.0000 mg | PREFILLED_SYRINGE | Freq: Two times a day (BID) | INTRAMUSCULAR | Status: DC

## 2023-07-10 MED ORDER — CHLORHEXIDINE GLUCONATE CLOTH 2 % EX PADS
6.0000 | MEDICATED_PAD | Freq: Every day | CUTANEOUS | Status: AC
  Administered 2023-07-10 – 2023-07-14 (×5): 6 via TOPICAL

## 2023-07-10 MED ORDER — PHENYLEPHRINE HCL-NACL 20-0.9 MG/250ML-% IV SOLN
25.0000 ug/min | INTRAVENOUS | Status: DC
  Filled 2023-07-10: qty 250

## 2023-07-10 MED ORDER — SODIUM CHLORIDE 0.9 % IV BOLUS
1000.0000 mL | Freq: Once | INTRAVENOUS | Status: AC
  Administered 2023-07-10: 1000 mL via INTRAVENOUS

## 2023-07-10 MED ORDER — MUPIROCIN 2 % EX OINT: 1.0000 | TOPICAL_OINTMENT | Freq: Two times a day (BID) | CUTANEOUS | Status: DC

## 2023-07-10 MED ORDER — ACETAMINOPHEN 500 MG PO TABS
1000.0000 mg | ORAL_TABLET | Freq: Four times a day (QID) | ORAL | Status: DC
  Administered 2023-07-10 – 2023-07-12 (×8): 1000 mg via ORAL
  Filled 2023-07-10 (×10): qty 2

## 2023-07-10 MED ORDER — HYDROMORPHONE HCL 1 MG/ML IJ SOLN
0.5000 mg | INTRAMUSCULAR | Status: DC | PRN
  Administered 2023-07-10: 1 mg via INTRAVENOUS
  Administered 2023-07-10: 0.5 mg via INTRAVENOUS
  Administered 2023-07-10: 1 mg via INTRAVENOUS
  Administered 2023-07-10: 0.75 mg via INTRAVENOUS
  Administered 2023-07-10 – 2023-07-14 (×11): 1 mg via INTRAVENOUS
  Filled 2023-07-10 (×15): qty 1

## 2023-07-10 MED ORDER — METOPROLOL TARTRATE 5 MG/5ML IV SOLN
5.0000 mg | Freq: Four times a day (QID) | INTRAVENOUS | Status: DC | PRN
  Administered 2023-07-12: 5 mg via INTRAVENOUS
  Filled 2023-07-10: qty 5

## 2023-07-10 MED ORDER — LORAZEPAM 1 MG PO TABS: 1.0000 mg | ORAL_TABLET | ORAL | Status: DC | PRN

## 2023-07-10 MED ORDER — PANTOPRAZOLE SODIUM 40 MG IV SOLR
40.0000 mg | Freq: Every day | INTRAVENOUS | Status: DC
  Administered 2023-07-10: 40 mg via INTRAVENOUS
  Filled 2023-07-10: qty 10

## 2023-07-10 MED ORDER — POLYETHYLENE GLYCOL 3350 17 G PO PACK: 17.0000 g | PACK | Freq: Every day | ORAL | Status: DC | PRN

## 2023-07-10 MED ORDER — METHOCARBAMOL 1000 MG/10ML IJ SOLN
500.0000 mg | Freq: Three times a day (TID) | INTRAVENOUS | Status: DC
  Administered 2023-07-10: 500 mg via INTRAVENOUS
  Filled 2023-07-10: qty 500

## 2023-07-10 MED ORDER — PANTOPRAZOLE SODIUM 40 MG PO TBEC: 40.0000 mg | DELAYED_RELEASE_TABLET | Freq: Every day | ORAL | Status: DC

## 2023-07-10 NOTE — Progress Notes (Signed)
An USGPIV (ultrasound guided PIV) has been placed for short-term vasopressor infusion. A correctly placed ivWatch must be used when administering Vasopressors. Should this treatment be needed beyond 24 hours, central line access should be obtained.  It will be the responsibility of the bedside nurse to follow best practice to prevent extravasations. HS [employer]
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
## 2023-07-10 NOTE — ED Notes (Signed)
Pt to CT with TRN  
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
## 2023-07-10 NOTE — H&P (Signed)
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 rib fractures. 12. Right L1 and L2 transverse process fractures. No spinal compression fracture. 13. At least a partial tear is noted in the right diaphragmatic crus with intramuscular hemorrhage and muscular thickening, small retroperitoneal hemorrhages extending inferiorly overlying the psoas muscles, and a small amount of hemorrhagic material inferior to both the liver and spleen. No pelvic hematoma. 14. Critical Value/emergent results were called by telephone at the time of interpretation on 07/10/2023 at 3:14 am to provider [facility] , who verbally acknowledged these results. Electronically Signed   By: Almira Bar M.D.   On: 07/10/2023 03:43   CT CERVICAL SPINE WO CONTRAST  Result Date: 07/10/2023 CLINICAL DATA:  Blunt polytrauma with the mechanism not specified. EXAM: CT HEAD WITHOUT CONTRAST CT MAXILLOFACIAL WITHOUT CONTRAST CT CERVICAL SPINE WITHOUT CONTRAST CT CHEST, ABDOMEN AND PELVIS WITH CONTRAST TECHNIQUE: Contiguous axial images were obtained from the base of the skull through the vertex without intravenous contrast. Multidetector CT imaging of the maxillofacial structures was performed. Multiplanar CT image reconstructions were also generated. A  small metallic BB was placed on the right temple in order to reliably differentiate right from left. Multidetector CT imaging of the cervical spine was performed without intravenous contrast. Multiplanar CT image reconstructions were also generated. Multidetector CT imaging of the chest, abdomen and pelvis was performed following the standard protocol during bolus administration of intravenous contrast. RADIATION DOSE REDUCTION: This exam was performed according to the departmental dose-optimization program which includes automated exposure control, adjustment of the mA and/or kV according to patient size and/or use of iterative reconstruction technique. CONTRAST:  75mL OMNIPAQUE IOHEXOL 350 MG/ML SOLN COMPARISON:  CT scan head, maxillofacial and cervical spine studies 08/25/2020. No other comparison studies. FINDINGS: CT HEAD FINDINGS Brain: There is mild cerebral atrophy, small-vessel disease atrophic ventricular prominence. Cerebellum and brainstem are unremarkable. No cortical based infarct, hemorrhage or mass are seen and no mass effect. There is no midline shift. The basal cisterns are clear. Vascular: There are patchy calcifications in the siphons. There are no hyperdense central vessels. Skull: Negative for fracture or focal lesions. Plate fixation noted over the left frontal sinus, as before. Other: None.  No appreciable scalp hematoma. CT MAXILLOFACIAL FINDINGS Osseous: This study is significantly motion limited. There is no obvious displaced fracture but a subtle fracture would be missed due to the amount of motion degradation on the images. There is no mandibular dislocation. There are multifocal dental caries warranting follow-up with a dentist. There is a chronic fracture deformity of the nasal bone.  Orbits: Old left orbital floor fracture mesh repair. A small amount of orbital fat is again noted as below the mesh as well as a bone fragment. No acute orbital fracture is seen. There is mild chronic  depressed fracture deformity of the left lamina papyracea. The bilateral orbital contents are unremarkable. Sinuses: There is a chronic depressed fracture of the anterior wall the left maxillary sinus with overlying fracture fixation bloating, which extends to the nasal bone. There is mild-to-moderate membrane thickening in the maxillary sinuses and mild membrane disease in the frontal, ethmoid and sphenoid sinuses. There are no sinus fluid levels or mastoid effusions. There is a left external auditory canal wax impaction. Nasal septum is S shaped. The nasal passages are unobstructed. Soft tissues: No focal soft tissue swelling. Motion artifact obscures fine detail. There are stones in the palatine tonsils. CT CERVICAL SPINE FINDINGS Alignment: Normal. Skull base and vertebrae: Study is motion limited. No spinal compression fracture is seen. No displaced fractures evident but a subtle fracture would be missed due to motion degradation. Soft tissues and spinal canal: No prevertebral fluid or swelling. No visible canal hematoma. There calcifications at the carotid bifurcations on the left-greater-than-right. No thyroid mass. Disc levels: The cervical discs are again noted degenerated, greatest disc space loss C3-4 through C6-7. There are bidirectional osteophytes. 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 As a result, relatively mild posterior disc osteophyte complexes at C4-5, C5-6 and C6-7 are noted causing spinal canal stenosis and mild spondylotic cord compression. There is multilevel significant degenerative foraminal stenosis due to uncinate joint and facet hypertrophy, also exacerbated by the short pedicles. Similar findings were noted previously. Other: None. CT CHEST FINDINGS Cardiovascular: There is mild cardiomegaly with three-vessel coronary artery calcifications. Pulmonary arteries and veins are normal caliber. The  pulmonary arteries are centrally clear. There is no pericardial effusion. There is a cuff of hypodense material surrounding the aortic lumen beginning in the proximal aortic arch continuing down to the hiatal segment, but not continuing below the diaphragm. This is consistent with a type B intramural hematoma with similar extension of this to surround the proximal most great vessels. No mediastinal hematoma is seen. There is no intraluminal dissection. There is mild scattered aortic calcific plaque. There is no aortic aneurysm. 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 This measures 1.5 cm craniocaudal, 7 mm across the base and 4 mm depth. Mediastinum/Nodes: No enlarged mediastinal, hilar, or axillary lymph nodes. Thyroid gland, trachea, and esophagus demonstrate no significant findings. Lungs/Pleura: There is respiratory motion on exam. There are trace pleural effusions. Pneumothorax. Low inspiration on exam with moderately elevated right diaphragm. There are posterior opacities in the upper and lower lobes, in large part if not all probably due to dependent atelectasis, component of pulmonary contusions is not excluded due to the presence of rib fractures. There is diffuse bronchial thickening. This is greater in the lower lobes. There is no confluent consolidation and no nodules are seen through the breathing motion. Musculoskeletal: There are slightly displaced fractures of the left anterolateral third through seventh ribs, the left posteromedial sixth and seventh ribs and left posterior eleventh rib. On the right, there are slightly displaced fractures of the anterolateral fourth through eighth ribs and the posteromedial eleventh and twelfth ribs. No spinal fracture seen. The sternum is intact. The visualized  shoulder girdles are intact. In addition, there is evidence of at least a partial tear of the  right crus of the diaphragm which is swollen and with patchy increased opacity within the muscle consistent with intramuscular hemorrhage. There is trace retrocrural hemorrhage as well. CT ABDOMEN AND PELVIS FINDINGS Hepatobiliary: No hepatic injury or perihepatic hematoma. There is a small volume of hemorrhage tracking inferior to the liver, probably originating from the diaphragmatic tear. Gallbladder is unremarkable. There is no biliary dilatation, no liver mass. Mild hepatic steatosis. Pancreas: No abnormality. Spleen: No splenic laceration is seen. There is a small volume of retroperitoneal hemorrhage extending inferior to the spleen probably tracked over from the diaphragmatic tear. Adrenals/Urinary Tract: No adrenal or perirenal hemorrhage, mass enhancement, stones or hydronephrosis. Normal bladder. Stomach/Bowel: No dilatation or wall thickening. Uncomplicated left-sided diverticula. An appendix is not seen. Vascular/Lymphatic: There is abdominal aortic atherosclerosis. No AAA. No abdominal aortic hematoma. The portal vein opacifies normally. Reproductive: No prostatomegaly. Other: As above, a small amount hemorrhage tracks inferior to both liver and spleen, probably originating from the tear in the right diaphragmatic crus. Additional small hemoperitoneum medial to the right pararenal space and overlying the right psoas muscle. No pelvic hematoma is seen. Small amount of additional hemorrhage overlying the left psoas. There is no free air. Musculoskeletal: 3.4 cm nonexpansile sclerotic lesion in the posterior left ilium. Additional small sclerotic lesions in the right ilium. There is a 1.3 cm sclerotic lesion in the intertrochanteric proximal left femur as well. Consider bone scintigraphy for follow-up when clinically feasible. A benign process is favored but metastases are not excluded at this age. There are slightly displaced transverse process fractures on the right at L1 and L2 but no spinal compression  fracture. There are degenerative changes of the lumbar spine greatest at L4-5 where there is disc collapse and grade 1 spondylolisthesis with acquired spinal stenosis. IMPRESSION: 1. No acute intracranial CT findings or depressed skull fractures. Chronic changes. 2. Significantly motion limited maxillofacial CT without obvious displaced fractures. 3. Chronic fracture deformities of the nasal bone, left orbital floor and anterior wall of the left maxillary sinus with surgical repairs. 4. Multifocal dental caries warranting follow-up with a dentist. 5. No cervical spine fracture or listhesis.  Degenerative changes 6. 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 No intraluminal dissection or mediastinal hematoma. This extends to surround the proximal great vessels as well. Only other relevant differential would be aortitis/arteritis but this is considered much less likely. 7. Trace pleural effusions.  No pneumothorax 8. Dependent opacities in the upper and lower lobes, probably due to dependent atelectasis with component of pulmonary contusions not excluded. 9. Bronchitis. 10. Cardiomegaly with CAD and aortic and great vessel atherosclerosis. 11. Multiple bilateral rib fractures. 12. Right L1 and L2 transverse process fractures. No spinal compression fracture. 13. At least a partial tear is noted in the right diaphragmatic crus with intramuscular hemorrhage and muscular thickening, small retroperitoneal hemorrhages extending inferiorly overlying the psoas muscles, and a small amount of hemorrhagic material inferior to both the liver and spleen. No pelvic hematoma. 14. Critical Value/emergent results were called by telephone at the time of interpretation on 07/10/2023 at 3:14 am to provider [facility] , who verbally acknowledged these results. Electronically Signed   By: Almira Bar M.D.   On: 07/10/2023  03:43   CT CHEST ABDOMEN PELVIS W CONTRAST  Result Date: 07/10/2023 CLINICAL DATA:  Blunt  polytrauma with the mechanism not specified. EXAM: CT HEAD WITHOUT CONTRAST CT MAXILLOFACIAL WITHOUT CONTRAST CT CERVICAL SPINE WITHOUT CONTRAST CT CHEST, ABDOMEN AND PELVIS WITH CONTRAST TECHNIQUE: Contiguous axial images were obtained from the base of the skull through the vertex without intravenous contrast. Multidetector CT imaging of the maxillofacial structures was performed. Multiplanar CT image reconstructions were also generated. A small metallic BB was placed on the right temple in order to reliably differentiate right from left. Multidetector CT imaging of the cervical spine was performed without intravenous contrast. Multiplanar CT image reconstructions were also generated. Multidetector CT imaging of the chest, abdomen and pelvis was performed following the standard protocol during bolus administration of intravenous contrast. RADIATION DOSE REDUCTION: This exam was performed according to the departmental dose-optimization program which includes automated exposure control, adjustment of the mA and/or kV according to patient size and/or use of iterative reconstruction technique. CONTRAST:  75mL OMNIPAQUE IOHEXOL 350 MG/ML SOLN COMPARISON:  CT scan head, maxillofacial and cervical spine studies 08/25/2020. No other comparison studies. FINDINGS: CT HEAD FINDINGS Brain: There is mild cerebral atrophy, small-vessel disease atrophic ventricular prominence. Cerebellum and brainstem are unremarkable. No cortical based infarct, hemorrhage or mass are seen and no mass effect. There is no midline shift. The basal cisterns are clear. Vascular: There are patchy calcifications in the siphons. There are no hyperdense central vessels. Skull: Negative for fracture or focal lesions. Plate fixation noted over the left frontal sinus, as before. Other: None.  No appreciable scalp hematoma. CT MAXILLOFACIAL FINDINGS Osseous: This  study is significantly motion limited. There is no obvious displaced fracture but a subtle fracture would be missed due to the amount of motion degradation on the images. There is no mandibular dislocation. There are multifocal dental caries warranting follow-up with a dentist. There is a chronic fracture deformity of the nasal bone. Orbits: Old left orbital floor fracture mesh repair. A small amount of orbital fat is again noted as below the mesh as well as a bone fragment. No acute orbital fracture is seen. There is mild chronic depressed fracture deformity of the left lamina papyracea. The bilateral orbital contents are unremarkable. Sinuses: There is a chronic depressed fracture of the anterior wall the left maxillary sinus with overlying fracture fixation bloating, which extends to the nasal bone. There is mild-to-moderate membrane thickening in the maxillary sinuses and mild membrane disease in the frontal, ethmoid and sphenoid sinuses. There are no sinus fluid levels or mastoid effusions. There is a left external auditory canal wax impaction. Nasal septum is S shaped. The nasal passages are unobstructed. Soft tissues: No focal soft tissue swelling. Motion artifact obscures fine detail. There are stones in the palatine tonsils. CT CERVICAL SPINE FINDINGS Alignment: Normal. Skull base and vertebrae: Study is motion limited. No spinal compression fracture is seen. No displaced fractures evident but a subtle fracture would be missed due to motion degradation. Soft tissues and spinal canal: No prevertebral fluid or swelling. No visible canal hematoma. There calcifications at the carotid bifurcations on the left-greater-than-right. No thyroid mass. Disc levels: The cervical discs are again noted degenerated, greatest disc space loss C3-4 through C6-7. There are bidirectional osteophytes. 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 As a result, relatively mild posterior disc osteophyte complexes at C4-5, C5-6 and C6-7 are noted causing spinal canal stenosis and mild spondylotic cord compression. There is multilevel significant degenerative foraminal stenosis due to uncinate  joint and facet hypertrophy, also exacerbated by the short pedicles. Similar findings were noted previously. Other: None. CT CHEST FINDINGS Cardiovascular: There is mild cardiomegaly with three-vessel coronary artery calcifications. Pulmonary arteries and veins are normal caliber. The pulmonary arteries are centrally clear. There is no pericardial effusion. There is a cuff of hypodense material surrounding the aortic lumen beginning in the proximal aortic arch continuing down to the hiatal segment, but not continuing below the diaphragm. This is consistent with a type B intramural hematoma with similar extension of this to surround the proximal most great vessels. No mediastinal hematoma is seen. There is no intraluminal dissection. There is mild scattered aortic calcific plaque. There is no aortic aneurysm. 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 This measures 1.5 cm craniocaudal, 7 mm across the base and 4 mm depth. Mediastinum/Nodes: No enlarged mediastinal, hilar, or axillary lymph nodes. Thyroid gland, trachea, and esophagus demonstrate no significant findings. Lungs/Pleura: There is respiratory motion on exam. There are trace pleural effusions. Pneumothorax. Low inspiration on exam with moderately elevated right diaphragm. There are posterior opacities in the upper and lower lobes, in large part if not all probably due to dependent atelectasis, component of pulmonary contusions is not excluded due to the presence of rib fractures. There is diffuse bronchial thickening. This is greater in the lower lobes. There is no confluent consolidation and no  nodules are seen through the breathing motion. Musculoskeletal: There are slightly displaced fractures of the left anterolateral third through seventh ribs, the left posteromedial sixth and seventh ribs and left posterior eleventh rib. On the right, there are slightly displaced fractures of the anterolateral fourth through eighth ribs and the posteromedial eleventh and twelfth ribs. No spinal fracture seen. The sternum is intact. The visualized shoulder girdles are intact. In addition, there is evidence of at least a partial tear of the right crus of the diaphragm which is swollen and with patchy increased opacity within the muscle consistent with intramuscular hemorrhage. There is trace retrocrural hemorrhage as well. CT ABDOMEN AND PELVIS FINDINGS Hepatobiliary: No hepatic injury or perihepatic hematoma. There is a small volume of hemorrhage tracking inferior to the liver, probably originating from the diaphragmatic tear. Gallbladder is unremarkable. There is no biliary dilatation, no liver mass. Mild hepatic steatosis. Pancreas: No abnormality. Spleen: No splenic laceration is seen. There is a small volume of retroperitoneal hemorrhage extending inferior to the spleen probably tracked over from the diaphragmatic tear. Adrenals/Urinary Tract: No adrenal or perirenal hemorrhage, mass enhancement, stones or hydronephrosis. Normal bladder. Stomach/Bowel: No dilatation or wall thickening. Uncomplicated left-sided diverticula. An appendix is not seen. Vascular/Lymphatic: There is abdominal aortic atherosclerosis. No AAA. No abdominal aortic hematoma. The portal vein opacifies normally. Reproductive: No prostatomegaly. Other: As above, a small amount hemorrhage tracks inferior to both liver and spleen, probably originating from the tear in the right diaphragmatic crus. Additional small hemoperitoneum medial to the right pararenal space and overlying the right psoas muscle. No pelvic hematoma is seen. Small amount of  additional hemorrhage overlying the left psoas. There is no free air. Musculoskeletal: 3.4 cm nonexpansile sclerotic lesion in the posterior left ilium. Additional small sclerotic lesions in the right ilium. There is a 1.3 cm sclerotic lesion in the intertrochanteric proximal left femur as well. Consider bone scintigraphy for follow-up when clinically feasible. A benign process is favored but metastases are not excluded at this age. There are slightly displaced transverse  process fractures on the right at L1 and L2 but no spinal compression fracture. There are degenerative changes of the lumbar spine greatest at L4-5 where there is disc collapse and grade 1 spondylolisthesis with acquired spinal stenosis. IMPRESSION: 1. No acute intracranial CT findings or depressed skull fractures. Chronic changes. 2. Significantly motion limited maxillofacial CT without obvious displaced fractures. 3. Chronic fracture deformities of the nasal bone, left orbital floor and anterior wall of the left maxillary sinus with surgical repairs. 4. Multifocal dental caries warranting follow-up with a dentist. 5. No cervical spine fracture or listhesis.  Degenerative changes 6. 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 No intraluminal dissection or mediastinal hematoma. This extends to surround the proximal great vessels as well. Only other relevant differential would be aortitis/arteritis but this is considered much less likely. 7. Trace pleural effusions.  No pneumothorax 8. Dependent opacities in the upper and lower lobes, probably due to dependent atelectasis with component of pulmonary contusions not excluded. 9. Bronchitis. 10. Cardiomegaly with CAD and aortic and great vessel atherosclerosis. 11. Multiple bilateral rib fractures. 12. Right L1 and L2 transverse process fractures. No spinal compression fracture. 13. At least a  partial tear is noted in the right diaphragmatic crus with intramuscular hemorrhage and muscular thickening, small retroperitoneal hemorrhages extending inferiorly overlying the psoas muscles, and a small amount of hemorrhagic material inferior to both the liver and spleen. No pelvic hematoma. 14. Critical Value/emergent results were called by telephone at the time of interpretation on 07/10/2023 at 3:14 am to provider [facility] , who verbally acknowledged these results. Electronically Signed   By: Almira Bar M.D.   On: 07/10/2023 03:43   DG Chest Port 1 View  Result Date: 07/10/2023 CLINICAL DATA:  Status post trauma. EXAM: PORTABLE CHEST 1 VIEW COMPARISON:  None Available. FINDINGS: The cardiac silhouette is mildly enlarged. Low lung volumes are seen with subsequent crowding of the bronchovascular lung markings. Mild bilateral infrahilar atelectatic changes are seen. No pleural effusion or pneumothorax is identified. No acute osseous abnormalities are identified. IMPRESSION: Low lung volumes with mild bilateral infrahilar atelectasis. Electronically Signed   By: Aram Candela M.D.   On: 07/10/2023 02:41   CT T-SPINE NO CHARGE  Result Date: 07/10/2023 CLINICAL DATA:  Trauma EXAM: CT Thoracic and Lumbar spine without contrast TECHNIQUE: Multiplanar CT images of the thoracic and lumbar spine were reconstructed from contemporary CT of the Chest, Abdomen, and Pelvis. RADIATION DOSE REDUCTION: This exam was performed according to the departmental dose-optimization program which includes automated exposure control, adjustment of the mA and/or kV according to patient size and/or use of iterative reconstruction technique. CONTRAST:  No additional contrast COMPARISON:  None Available. FINDINGS: CT THORACIC SPINE FINDINGS Alignment: Normal. Vertebrae: There is widening of the T11-12 disc space with a fracture of the anterior inferior corner of T11. Disc levels: No spinal canal stenosis CT LUMBAR  SPINE FINDINGS Segmentation: 5 lumbar type vertebrae. Alignment: Grade 1 anterolisthesis at L4-5 Vertebrae: Right transverse process fractures at L1, L2 and L4 Disc levels: Multilevel facet arthrosis. Severe bilateral L4 foraminal stenosis. IMPRESSION: 1. Widening of the T11-12 disc space with a fracture of the anterior inferior corner of T11. This is concerning for ligamentous injury and MRI of the thoracic spine is recommended when the patient is stable. 2. Right transverse process fractures at L1, L2 and L4. 3. Please see report for CT chest, abdomen and pelvis for  discussion of other acute findings. Electronically Signed   By: Deatra Robinson M.D.   On: 07/10/2023 02:39   CT L-SPINE NO CHARGE  Result Date: 07/10/2023 CLINICAL DATA:  Trauma EXAM: CT Thoracic and Lumbar spine without contrast TECHNIQUE: Multiplanar CT images of the thoracic and lumbar spine were reconstructed from contemporary CT of the Chest, Abdomen, and Pelvis. RADIATION DOSE REDUCTION: This exam was performed according to the departmental dose-optimization program which includes automated exposure control, adjustment of the mA and/or kV according to patient size and/or use of iterative reconstruction technique. CONTRAST:  No additional contrast COMPARISON:  None Available. FINDINGS: CT THORACIC SPINE FINDINGS Alignment: Normal. Vertebrae: There is widening of the T11-12 disc space with a fracture of the anterior inferior corner of

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
## 2023-07-10 NOTE — ED Notes (Signed)
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 process fractures on the right at L1 and L2 but no spinal compression fracture. There are degenerative changes of the lumbar spine greatest at L4-5 where there is disc collapse and grade 1 spondylolisthesis with acquired spinal stenosis. IMPRESSION: 1. No acute intracranial CT findings or depressed skull fractures. Chronic changes. 2. Significantly motion limited maxillofacial CT without obvious displaced fractures. 3. Chronic fracture deformities of the nasal bone, left orbital floor and anterior wall of the left maxillary sinus with surgical repairs. 4. Multifocal dental caries warranting follow-up with a dentist. 5. No cervical spine fracture or listhesis.  Degenerative changes 6. 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 No intraluminal dissection or mediastinal hematoma. This extends to surround the proximal great vessels as well. Only other relevant differential would be aortitis/arteritis but this is considered much less likely. 7.  Trace pleural effusions.  No pneumothorax 8. Dependent opacities in the upper and lower lobes, probably due to dependent atelectasis with component of pulmonary contusions not excluded. 9. Bronchitis. 10. Cardiomegaly with CAD and aortic and great vessel atherosclerosis. 11. Multiple bilateral rib fractures. 12. Right L1 and L2 transverse process fractures. No spinal compression fracture. 13. At least a partial tear is noted in the right diaphragmatic crus with intramuscular hemorrhage and muscular thickening, small retroperitoneal hemorrhages extending inferiorly overlying the psoas muscles, and a small amount of hemorrhagic material inferior to both the liver and spleen. No pelvic hematoma. 14. Critical Value/emergent results were called by  telephone at the time of interpretation on 07/10/2023 at 3:14 am to provider [facility] , who verbally acknowledged these results. Electronically Signed   By: Almira Bar M.D.   On: 07/10/2023 03:43   CT CERVICAL SPINE WO CONTRAST  Result Date: 07/10/2023 CLINICAL DATA:  Blunt polytrauma with the mechanism not specified. EXAM: CT HEAD WITHOUT CONTRAST CT MAXILLOFACIAL WITHOUT CONTRAST CT CERVICAL SPINE WITHOUT CONTRAST CT CHEST, ABDOMEN AND PELVIS WITH CONTRAST TECHNIQUE: Contiguous axial images were obtained from the base of the skull through the vertex without intravenous contrast. Multidetector CT imaging of the maxillofacial structures was performed. Multiplanar CT image reconstructions were also generated. A small metallic BB was placed on the right temple in order to reliably differentiate right from left. Multidetector CT imaging of the cervical spine was performed without intravenous contrast. Multiplanar CT image reconstructions were also generated. Multidetector CT imaging of the chest, abdomen and pelvis was performed following the standard protocol during bolus administration of intravenous contrast. RADIATION DOSE REDUCTION: This exam was performed  according to the departmental dose-optimization program which includes automated exposure control, adjustment of the mA and/or kV according to patient size and/or use of iterative reconstruction technique. CONTRAST:  75mL OMNIPAQUE IOHEXOL 350 MG/ML SOLN COMPARISON:  CT scan head, maxillofacial and cervical spine studies 08/25/2020. No other comparison studies. FINDINGS: CT HEAD FINDINGS Brain: There is mild cerebral atrophy, small-vessel disease atrophic ventricular prominence. Cerebellum and brainstem are unremarkable. No cortical based infarct, hemorrhage or mass are seen and no mass effect. There is no midline shift. The basal cisterns are clear. Vascular: There are patchy calcifications in the siphons. There are no hyperdense central vessels. Skull: Negative for fracture or focal lesions. Plate fixation noted over the left frontal sinus, as before. Other: None.  No appreciable scalp hematoma. CT MAXILLOFACIAL FINDINGS Osseous: This study is significantly motion limited. There is no obvious displaced fracture but a subtle fracture would be missed due to the amount of motion degradation on the images. There is no mandibular dislocation. There are multifocal dental caries warranting follow-up with a dentist. There is a chronic fracture deformity of the nasal bone. Orbits: Old left orbital floor fracture mesh repair. A small amount of orbital fat is again noted as below the mesh as well as a bone fragment. No acute orbital fracture is seen. There is mild chronic depressed fracture deformity of the left lamina papyracea. The bilateral orbital contents are unremarkable. Sinuses: There is a chronic depressed fracture of the anterior wall the left maxillary sinus with overlying fracture fixation bloating, which extends to the nasal bone. There is mild-to-moderate membrane thickening in the maxillary sinuses and mild membrane disease in the frontal, ethmoid and sphenoid sinuses. There are no sinus fluid levels or  mastoid effusions. There is a left external auditory canal wax impaction. Nasal septum is S shaped. The nasal passages are unobstructed. Soft tissues: No focal soft tissue swelling. Motion artifact obscures fine detail. There are stones in the palatine tonsils. CT CERVICAL SPINE FINDINGS Alignment: Normal. Skull base and vertebrae: Study is motion limited. No spinal compression fracture is seen. No displaced fractures evident but a subtle fracture would be missed due to motion degradation. Soft tissues and spinal canal: No prevertebral fluid or swelling. No visible canal hematoma. There calcifications at the carotid bifurcations on the left-greater-than-right. No thyroid mass. Disc levels: The cervical discs are again noted degenerated, greatest disc space loss C3-4 through C6-7. There are bidirectional osteophytes. 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 As a result, relatively mild posterior disc osteophyte complexes at C4-5, C5-6 and C6-7 are noted causing spinal canal stenosis and mild spondylotic cord compression. There is multilevel significant degenerative foraminal stenosis due to uncinate joint and facet hypertrophy, also exacerbated by the short pedicles. Similar findings were noted previously. Other: None. CT CHEST FINDINGS Cardiovascular: There is mild cardiomegaly with three-vessel coronary artery calcifications. Pulmonary arteries and veins are normal caliber. The pulmonary arteries are centrally clear. There is no pericardial effusion. There is a cuff of hypodense material surrounding the aortic lumen beginning in the proximal aortic arch continuing down to the hiatal segment, but not continuing below the diaphragm. This is consistent with a type B intramural hematoma with similar extension of this to surround the proximal most great vessels. No mediastinal hematoma is seen. There is no intraluminal dissection.  There is mild scattered aortic calcific plaque. There is no aortic aneurysm. 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 This measures 1.5 cm craniocaudal, 7 mm across the base and 4 mm depth. Mediastinum/Nodes: No enlarged mediastinal, hilar, or axillary lymph nodes. Thyroid gland, trachea, and esophagus demonstrate no significant findings. Lungs/Pleura: There is respiratory motion on exam. There are trace pleural effusions. Pneumothorax. Low inspiration on exam with moderately elevated right diaphragm. There are posterior opacities in the upper and lower lobes, in large part if not all probably due to dependent atelectasis, component of pulmonary contusions is not excluded due to the presence of rib fractures. There is diffuse bronchial thickening. This is greater in the lower lobes. There is no confluent consolidation and no nodules are seen through the breathing motion. Musculoskeletal: There are slightly displaced fractures of the left anterolateral third through seventh ribs, the left posteromedial sixth and seventh ribs and left posterior eleventh rib. On the right, there are slightly displaced fractures of the anterolateral fourth through eighth ribs and the posteromedial eleventh and twelfth ribs. No spinal fracture seen. The sternum is intact. The visualized shoulder girdles are intact. In addition, there is evidence of at least a partial tear of the right crus of the diaphragm which is swollen and with patchy increased opacity within the muscle consistent with intramuscular hemorrhage. There is trace retrocrural hemorrhage as well. CT ABDOMEN AND PELVIS FINDINGS Hepatobiliary: No hepatic injury or perihepatic hematoma. There is a small volume of hemorrhage tracking inferior to the liver, probably originating from the diaphragmatic tear. Gallbladder is unremarkable. There is no biliary dilatation, no  liver mass. Mild hepatic steatosis. Pancreas: No abnormality. Spleen: No splenic laceration is seen. There is a small volume of retroperitoneal hemorrhage extending inferior to the spleen probably tracked over from the diaphragmatic tear. Adrenals/Urinary Tract: No adrenal or perirenal hemorrhage, mass enhancement, stones or hydronephrosis. Normal bladder. Stomach/Bowel: No dilatation or wall thickening. Uncomplicated left-sided diverticula. An appendix is not seen. Vascular/Lymphatic: There is abdominal aortic atherosclerosis. No AAA. No abdominal aortic hematoma. The portal vein opacifies normally. Reproductive: No prostatomegaly. Other: As above, a small amount hemorrhage tracks inferior to both liver and spleen, probably originating from the tear in the right diaphragmatic crus. Additional small hemoperitoneum medial to the right pararenal space and overlying the right psoas muscle. No pelvic hematoma is seen. Small amount of additional hemorrhage overlying the left psoas. There is no free air. Musculoskeletal: 3.4 cm nonexpansile sclerotic lesion in the posterior left  ilium. Additional small sclerotic lesions in the right ilium. There is a 1.3 cm sclerotic lesion in the intertrochanteric proximal left femur as well. Consider bone scintigraphy for follow-up when clinically feasible. A benign process is favored but metastases are not excluded at this age. There are slightly displaced transverse process fractures on the right at L1 and L2 but no spinal compression fracture. There are degenerative changes of the lumbar spine greatest at L4-5 where there is disc collapse and grade 1 spondylolisthesis with acquired spinal stenosis. IMPRESSION: 1. No acute intracranial CT findings or depressed skull fractures. Chronic changes. 2. Significantly motion limited maxillofacial CT without obvious displaced fractures. 3. Chronic fracture deformities of the nasal bone, left orbital floor and anterior wall of the left  maxillary sinus with surgical repairs. 4. Multifocal dental caries warranting follow-up with a dentist. 5. No cervical spine fracture or listhesis.  Degenerative changes 6. 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 No intraluminal dissection or mediastinal hematoma. This extends to surround the proximal great vessels as well. Only other relevant differential would be aortitis/arteritis but this is considered much less likely. 7. Trace pleural effusions.  No pneumothorax 8. Dependent opacities in the upper and lower lobes, probably due to dependent atelectasis with component of pulmonary contusions not excluded. 9. Bronchitis. 10. Cardiomegaly with CAD and aortic and great vessel atherosclerosis. 11. Multiple bilateral rib fractures. 12. Right L1 and L2 transverse process fractures. No spinal compression fracture. 13. At least a partial tear is noted in the right diaphragmatic crus with intramuscular hemorrhage and muscular thickening, small retroperitoneal hemorrhages extending inferiorly overlying the psoas muscles, and a small amount of hemorrhagic material inferior to both the liver and spleen. No pelvic hematoma. 14. Critical Value/emergent results were called by telephone at the time of interpretation on 07/10/2023 at 3:14 am to provider [facility] , who verbally acknowledged these results. Electronically Signed   By: Almira Bar M.D.   On: 07/10/2023 03:43   CT CHEST ABDOMEN PELVIS W CONTRAST  Result Date: 07/10/2023 CLINICAL DATA:  Blunt polytrauma with the mechanism not specified. EXAM: CT HEAD WITHOUT CONTRAST CT MAXILLOFACIAL WITHOUT CONTRAST CT CERVICAL SPINE WITHOUT CONTRAST CT CHEST, ABDOMEN AND PELVIS WITH CONTRAST TECHNIQUE: Contiguous axial images were obtained from the base of the skull through the vertex without intravenous contrast. Multidetector CT imaging of the  maxillofacial structures was performed. Multiplanar CT image reconstructions were also generated. A small metallic BB was placed on the right temple in order to reliably differentiate right from left. Multidetector CT imaging of the cervical spine was performed without intravenous contrast. Multiplanar CT image reconstructions were also generated. Multidetector CT imaging of the chest, abdomen and pelvis was performed following the standard protocol during bolus administration of intravenous contrast. RADIATION DOSE REDUCTION: This exam was performed according to the departmental dose-optimization program which includes automated exposure control, adjustment of the mA and/or kV according to patient size and/or use of iterative reconstruction technique. CONTRAST:  75mL OMNIPAQUE IOHEXOL 350 MG/ML SOLN COMPARISON:  CT scan head, maxillofacial and cervical spine studies 08/25/2020. No other comparison studies. FINDINGS: CT HEAD FINDINGS Brain: There is mild cerebral atrophy, small-vessel disease atrophic ventricular prominence. Cerebellum and brainstem are unremarkable. No cortical based infarct, hemorrhage or mass are seen and no mass effect. There is no midline shift. The basal cisterns are clear. Vascular: There are patchy calcifications in the siphons. There are no hyperdense central vessels. Skull:  Negative for fracture or focal lesions. Plate fixation noted over the left frontal sinus, as before. Other: None.  No appreciable scalp hematoma. CT MAXILLOFACIAL FINDINGS Osseous: This study is significantly motion limited. There is no obvious displaced fracture but a subtle fracture would be missed due to the amount of motion degradation on the images. There is no mandibular dislocation. There are multifocal dental caries warranting follow-up with a dentist. There is a chronic fracture deformity of the nasal bone. Orbits: Old left orbital floor fracture mesh repair. A small amount of orbital fat is again noted as  below the mesh as well as a bone fragment. No acute orbital fracture is seen. There is mild chronic depressed fracture deformity of the left lamina papyracea. The bilateral orbital contents are unremarkable. Sinuses: There is a chronic depressed fracture of the anterior wall the left maxillary sinus with overlying fracture fixation bloating, which extends to the nasal bone. There is mild-to-moderate membrane thickening in the maxillary sinuses and mild membrane disease in the frontal, ethmoid and sphenoid sinuses. There are no sinus fluid levels or mastoid effusions. There is a left external auditory canal wax impaction. Nasal septum is S shaped. The nasal passages are unobstructed. Soft tissues: No focal soft tissue swelling. Motion artifact obscures fine detail. There are stones in the palatine tonsils. CT CERVICAL SPINE FINDINGS Alignment: Normal. Skull base and vertebrae: Study is motion limited. No spinal compression fracture is seen. No displaced fractures evident but a subtle fracture would be missed due to motion degradation. Soft tissues and spinal canal: No prevertebral fluid or swelling. No visible canal hematoma. There calcifications at the carotid bifurcations on the left-greater-than-right. No thyroid mass. Disc levels: The cervical discs are again noted degenerated, greatest disc space loss C3-4 through C6-7. There are bidirectional osteophytes. 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 As a result, relatively mild posterior disc osteophyte complexes at C4-5, C5-6 and C6-7 are noted causing spinal canal stenosis and mild spondylotic cord compression. There is multilevel significant degenerative foraminal stenosis due to uncinate joint and facet hypertrophy, also exacerbated by the short pedicles. Similar findings were noted previously. Other: None. CT CHEST FINDINGS Cardiovascular: There is mild cardiomegaly with  three-vessel coronary artery calcifications. Pulmonary arteries and veins are normal caliber. The pulmonary arteries are centrally clear. There is no pericardial effusion. There is a cuff of hypodense material surrounding the aortic lumen beginning in the proximal aortic arch continuing down to the hiatal segment, but not continuing below the diaphragm. This is consistent with a type B intramural hematoma with similar extension of this to surround the proximal most great vessels. No mediastinal hematoma is seen. There is no intraluminal dissection. There is mild scattered aortic calcific plaque. There is no aortic aneurysm. 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 This measures 1.5 cm craniocaudal, 7 mm across the base and 4 mm depth. Mediastinum/Nodes: No enlarged mediastinal, hilar, or axillary lymph nodes. Thyroid gland, trachea, and esophagus demonstrate no significant findings. Lungs/Pleura: There is respiratory motion on exam. There are trace pleural effusions. Pneumothorax. Low inspiration on exam with moderately elevated right diaphragm. There are posterior opacities in the upper and lower lobes, in large part if not all probably due to dependent atelectasis, component of pulmonary contusions is not excluded due to the presence of rib fractures. There is diffuse  bronchial thickening. This is greater in the lower lobes. There is no confluent consolidation and no nodules are seen through the breathing motion. Musculoskeletal: There are slightly displaced fractures of the left anterolateral third through seventh ribs, the left posteromedial sixth and seventh ribs and left posterior eleventh rib. On the right, there are slightly displaced fractures of the anterolateral fourth through eighth ribs and the posteromedial eleventh and twelfth ribs. No spinal fracture seen. The sternum is intact. The  visualized shoulder girdles are intact. In addition, there is evidence of at least a partial tear of the right crus of the diaphragm which is swollen and with patchy increased opacity within the muscle consistent with intramuscular hemorrhage. There is trace retrocrural hemorrhage as well. CT ABDOMEN AND PELVIS FINDINGS Hepatobiliary: No hepatic injury or perihepatic hematoma. There is a small volume of hemorrhage tracking inferior to the liver, probably originating from the diaphragmatic tear. Gallbladder is unremarkable. There is no biliary dilatation, no liver mass. Mild hepatic steatosis. Pancreas: No abnormality. Spleen: No splenic laceration is seen. There is a small volume of retroperitoneal hemorrhage extending inferior to the spleen probably tracked over from the diaphragmatic tear. Adrenals/Urinary Tract: No adrenal or perirenal hemorrhage, mass enhancement, stones or hydronephrosis. Normal bladder. Stomach/Bowel: No dilatation or wall thickening. Uncomplicated left-sided diverticula. An appendix is not seen. Vascular/Lymphatic: There is abdominal aortic atherosclerosis. No AAA. No abdominal aortic hematoma. The portal vein opacifies normally. Reproductive: No prostatomegaly. Other: As above, a small amount hemorrhage tracks inferior to both liver and spleen, probably originating from the tear in the right diaphragmatic crus. Additional small hemoperitoneum medial to the right pararenal space and overlying the right psoas muscle. No pelvic hematoma is seen. Small amount of additional hemorrhage overlying the left psoas. There is no free air. Musculoskeletal: 3.4 cm nonexpansile sclerotic lesion in the posterior left ilium. Additional small sclerotic lesions in the right ilium. There is a 1.3 cm sclerotic lesion in the intertrochanteric proximal left femur as well. Consider bone scintigraphy for follow-up when clinically feasible. A benign process is favored but metastases are not excluded at this age.  There are slightly displaced transverse process fractures on the right at L1 and L2 but no spinal compression fracture. There are degenerative changes of the lumbar spine greatest at L4-5 where there is disc collapse and grade 1 spondylolisthesis with acquired spinal stenosis. IMPRESSION: 1. No acute intracranial CT findings or depressed skull fractures. Chronic changes. 2. Significantly motion limited maxillofacial CT without obvious displaced fractures. 3. Chronic fracture deformities of the nasal bone, left orbital floor and anterior wall of the left maxillary sinus with surgical repairs. 4. Multifocal dental caries warranting follow-up with a dentist. 5. No cervical spine fracture or listhesis.  Degenerative changes 6. 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 No intraluminal dissection or mediastinal hematoma. This extends to surround the proximal great vessels as well. Only other relevant differential would be aortitis/arteritis but this is considered much less likely. 7. Trace pleural effusions.  No pneumothorax 8. Dependent opacities in the upper and lower lobes, probably due to dependent atelectasis with component of pulmonary contusions not excluded. 9. Bronchitis. 10. Cardiomegaly with CAD and aortic and great vessel atherosclerosis. 11. Multiple bilateral rib fractures. 12. Right L1 and L2 transverse process fractures. No spinal compression fracture. 13. At least a partial tear is noted in the right diaphragmatic crus with intramuscular hemorrhage and muscular thickening, small retroperitoneal hemorrhages extending  inferiorly overlying the psoas muscles, and a small amount of hemorrhagic material inferior to both the liver and spleen. No pelvic hematoma. 14. Critical Value/emergent results were called by telephone at the time of interpretation on 07/10/2023 at 3:14 am to provider [facility] , who verbally acknowledged these results. Electronically Signed   By: Almira Bar M.D.   On: 07/10/2023 03:43   DG Chest Port 1 View  Result Date: 07/10/2023 CLINICAL DATA:  Status post trauma. EXAM: PORTABLE CHEST 1 VIEW COMPARISON:  None Available. FINDINGS: The cardiac silhouette is mildly enlarged. Low lung volumes are seen with subsequent crowding of the bronchovascular lung markings. Mild bilateral infrahilar atelectatic changes are seen. No pleural effusion or pneumothorax is identified. No acute osseous abnormalities are identified. IMPRESSION: Low lung volumes with mild bilateral infrahilar atelectasis. Electronically Signed   By: Aram Candela M.D.   On: 07/10/2023 02:41   CT T-SPINE NO CHARGE  Result Date: 07/10/2023 CLINICAL DATA:  Trauma EXAM: CT Thoracic and Lumbar spine without contrast TECHNIQUE: Multiplanar CT images of the thoracic and lumbar spine were reconstructed from contemporary CT of the Chest, Abdomen, and Pelvis. RADIATION DOSE REDUCTION: This exam was performed according to the departmental dose-optimization program which includes automated exposure control, adjustment of the mA and/or kV according to patient size and/or use of iterative reconstruction technique. CONTRAST:  No additional contrast COMPARISON:  None Available. FINDINGS: CT THORACIC SPINE FINDINGS Alignment: Normal. Vertebrae: There is widening of the T11-12 disc space with a fracture of the anterior inferior corner of T11. Disc levels: No spinal canal stenosis CT LUMBAR SPINE FINDINGS Segmentation: 5 lumbar type vertebrae. Alignment: Grade 1 anterolisthesis at L4-5 Vertebrae: Right transverse process fractures at L1, L2 and L4 Disc levels: Multilevel facet arthrosis. Severe bilateral L4 foraminal stenosis. IMPRESSION: 1. Widening of the T11-12 disc space with a fracture of the anterior inferior corner of T11. This is concerning for ligamentous injury and MRI of the thoracic spine is recommended when  the patient is stable. 2. Right transverse process fractures at L1, L2 and L4. 3. Please see report for CT chest, abdomen and pelvis for discussion of other acute findings. Electronically Signed   By: Deatra Robinson M.D.   On: 07/10/2023 02:39   CT L-SPINE NO CHARGE  Result Date: 07/10/2023 CLINICAL DATA:  Trauma EXAM: CT Thoracic and Lumbar spine without contrast TECHNIQUE: Multiplanar CT images of the thoracic and lumbar spine were reconstructed from contemporary CT of the Chest, Abdomen, and Pelvis. RADIATION DOSE REDUCTION: This exam was performed according to the departmental dose-optimization program which includes automated exposure control, adjustment of the mA and/or kV according to patient size and/or use of iterative reconstruction technique. CONTRAST:  No additional contrast COMPARISON:  None Available. FINDINGS: CT THORACIC SPINE FINDINGS Alignment: Normal. Vertebrae: There is widening of the T11-12 disc space with a fracture of the anterior inferior corner of T11. Disc levels: No spinal canal stenosis CT LUMBAR SPINE FINDINGS Segmentation: 5 lumbar type vertebrae. Alignment: Grade 1 anterolisthesis at L4-5 Vertebrae: Right transverse process fractures at L1, L2 and L4 Disc levels: Multilevel facet arthrosis. Severe bilateral L4 foraminal stenosis. IMPRESSION: 1. Widening of the T11-12 disc space with a fracture of the anterior inferior corner of T11. This is concerning for ligamentous injury and MRI of the thoracic spine is recommended when the patient is stable. 2. Right transverse process fractures at L1, L2 and L4. 3. Please see report for CT chest, abdomen and pelvis for discussion of other acute  findings. Electronically Signed   By: Deatra Robinson M.D.   On: 07/10/2023 02:39   DG Pelvis Portable  Result Date: 07/10/2023 CLINICAL DATA:  Status post trauma. EXAM: PORTABLE PELVIS 1-2 VIEWS COMPARISON:  None Available. FINDINGS: There is no evidence of acute pelvic fracture or diastasis. A 1.7  cm x 1.9 cm sclerotic focus is seen overlying the inter trochanteric region of the proximal left femur. IMPRESSION: 1. No acute pelvic fracture. 2. Sclerotic focus within the proximal left femur, likely representing a benign bone island. Electronically Signed   By: Aram Candela M.D.   On: 07/10/2023 02:01    Pending Labs Unresulted Labs (From admission, onward)     Start     Ordered   07/10/23 0531  Basic metabolic panel  Once,   R        07/10/23 0531   07/10/23 0447  HIV Antibody (routine testing w rflx)  (HIV Antibody (Routine testing w reflex) panel)  Once,   R        07/10/23 0452   07/10/23 0142  Urinalysis, Routine w reflex microscopic -Urine, Clean Catch  [facility] ED TRAUMA PANEL [facility])  Once,   URGENT       Question:  Specimen Source  Answer:  Urine, Clean Catch   07/10/23 0142            Vitals/Pain Today's Vitals   07/10/23 0552 07/10/23 0615 07/10/23 0624 07/10/23 0630  BP:  97/67 105/70 (!) 85/72  Pulse:  66 70 70  Resp:  20 17 (!) 24  Temp: (!) 97.5 F (36.4 C)     TempSrc: Oral     SpO2:  100% 100% 100%  Weight:      Height:      PainSc:        Isolation Precautions No active isolations  Medications Medications  acetaminophen (TYLENOL) tablet 1,000 mg (0 mg Oral Hold 07/10/23 0527)  docusate sodium (COLACE) capsule 100 mg (has no administration in time range)  polyethylene glycol (MIRALAX / GLYCOLAX) packet 17 g (has no administration in time range)  ondansetron (ZOFRAN-ODT) disintegrating tablet 4 mg (has no administration in time range)    Or  ondansetron (ZOFRAN) injection 4 mg (has no administration in time range)  metoprolol tartrate (LOPRESSOR) injection 5 mg (has no administration in time range)  hydrALAZINE (APRESOLINE) injection 10 mg (has no administration in time range)  LORazepam (ATIVAN) tablet 1-4 mg (has no administration in time range)    Or  LORazepam (ATIVAN) injection 1-4 mg (has no administration in time range)  thiamine (VITAMIN  B1) tablet 100 mg (has no administration in time range)    Or  thiamine (VITAMIN B1) injection 100 mg (has no administration in time range)  folic acid (FOLVITE) tablet 1 mg (has no administration in time range)  multivitamin with minerals tablet 1 tablet (has no administration in time range)  enoxaparin (LOVENOX) injection 30 mg (has no administration in time range)  0.9 %  sodium chloride infusion ( Intravenous New Bag/Given 07/10/23 0525)  oxyCODONE (Oxy IR/ROXICODONE) immediate release tablet 5 mg (has no administration in time range)  oxyCODONE (Oxy IR/ROXICODONE) immediate release tablet 10 mg (has no administration in time range)  HYDROmorphone (DILAUDID) injection 0.5-1 mg (has no administration in time range)  methocarbamol (ROBAXIN) tablet 500 mg ( Oral See Alternative 07/10/23 0612)    Or  methocarbamol (ROBAXIN) 500 mg in dextrose 5 % 50 mL IVPB (0 mg Intravenous Stopped 07/10/23 0612)  gabapentin (NEURONTIN) capsule 300 mg (has no administration in time range)  melatonin tablet 3 mg (has no administration in time range)  pantoprazole (PROTONIX) EC tablet 40 mg (has no administration in time range)    Or  pantoprazole (PROTONIX) injection 40 mg (has no administration in time range)  0.9 %  sodium chloride infusion (0 mLs Intravenous Stopped 07/10/23 0350)  iohexol (OMNIPAQUE) 350 MG/ML injection 75 mL (75 mLs Intravenous Contrast Given 07/10/23 0202)  fentaNYL (SUBLIMAZE) injection 50 mcg (50 mcg Intravenous Given 07/10/23 0253)  sodium chloride 0.9 % bolus 1,000 mL (0 mLs Intravenous Stopped 07/10/23 0433)  fentaNYL (SUBLIMAZE) injection 50 mcg (50 mcg Intravenous Given 07/10/23 0433)    Mobility Baseline walks     Focused Assessments Cardiac Assessment Handoff:  Cardiac Rhythm: Normal sinus rhythm Lab Results  Component Value Date   CKTOTAL 144 01/18/2008   CKMB 0.9 06/20/2013   TROPONINI < 0.02 06/20/2013   No results found for: "DDIMER" Does the Patient currently have  chest pain? Yes    R Recommendations: See Admitting Provider Note  Report given to:   Additional Notes: Pt daughter at bedside. Pt is complaining of pain but BP has been to low to give prn

## 2023-07-10 NOTE — Progress Notes (Signed)
Neurosurgery consulted for T spine injury. Discussed case with Dr. Mikal Plane, who reviewed patient's imaging and does not recommend MRI spine at this time. MRI cancelled.

## 2023-07-10 NOTE — ED Notes (Signed)
C-Collar changed to [facility].

## 2023-07-10 NOTE — Progress Notes (Signed)
Orthopedic Tech Progress Note Patient Details:  Jerry Daniels Nov 13, 1956 [id number]  Patient ID: Jerry Daniels, male   DOB: Jun 26, 1956, 67 y.o.   MRN: [id number] I attended trauma page. Trinna Post 07/10/2023, 4:23 AM

## 2023-07-10 NOTE — Consult Note (Signed)
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 free air. Musculoskeletal: 3.4 cm nonexpansile sclerotic lesion in the posterior left ilium. Additional small sclerotic lesions in the right ilium. There is a 1.3 cm sclerotic lesion in the intertrochanteric proximal left femur as well. Consider bone scintigraphy for follow-up when clinically feasible. A benign process is favored but metastases are not excluded at this age. There are slightly displaced transverse process fractures on the right at L1 and L2 but no spinal compression fracture. There are degenerative changes of the lumbar spine greatest at L4-5 where there is disc collapse and grade 1 spondylolisthesis with acquired spinal stenosis. IMPRESSION: 1. No acute intracranial CT findings or depressed skull fractures. Chronic changes. 2. Significantly motion limited maxillofacial CT without obvious displaced fractures. 3. Chronic fracture deformities of the nasal bone, left orbital floor and anterior wall of the left maxillary sinus with surgical repairs. 4. Multifocal dental caries warranting follow-up  with a dentist. 5. No cervical spine fracture or listhesis.  Degenerative changes 6. 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 No intraluminal dissection or mediastinal hematoma. This extends to surround the proximal great vessels as well. Only other relevant differential would be aortitis/arteritis but this is considered much less likely. 7. Trace pleural effusions.  No pneumothorax 8. Dependent opacities in the upper and lower lobes, probably due to dependent  atelectasis with component of pulmonary contusions not excluded. 9. Bronchitis. 10. Cardiomegaly with CAD and aortic and great vessel atherosclerosis. 11. Multiple bilateral rib fractures. 12. Right L1 and L2 transverse process fractures. No spinal compression fracture. 13. At least a partial tear is noted in the right diaphragmatic crus with intramuscular hemorrhage and muscular thickening, small retroperitoneal hemorrhages extending inferiorly overlying the psoas muscles, and a small amount of hemorrhagic material inferior to both the liver and spleen. No pelvic hematoma. 14. Critical Value/emergent results were called by telephone at the time of interpretation on 07/10/2023 at 3:14 am to provider [facility] , who verbally acknowledged these results. Electronically Signed   By: Almira Bar M.D.   On: 07/10/2023 03:43   CT CERVICAL SPINE WO CONTRAST  Result Date: 07/10/2023 CLINICAL DATA:  Blunt polytrauma with the mechanism not specified. EXAM: CT HEAD WITHOUT CONTRAST CT MAXILLOFACIAL WITHOUT CONTRAST CT CERVICAL SPINE WITHOUT CONTRAST CT CHEST, ABDOMEN AND PELVIS WITH CONTRAST TECHNIQUE: Contiguous axial images were obtained from the base of the skull through the vertex without intravenous contrast. Multidetector CT imaging of the maxillofacial structures was performed. Multiplanar CT image reconstructions were also generated. A  small metallic BB was placed on the right temple in order to reliably differentiate right from left. Multidetector CT imaging of the cervical spine was performed without intravenous contrast. Multiplanar CT image reconstructions were also generated. Multidetector CT imaging of the chest, abdomen and pelvis was performed following the standard protocol during bolus administration of intravenous contrast. RADIATION DOSE REDUCTION: This exam was performed according to the departmental dose-optimization program which includes automated exposure control, adjustment of the mA and/or kV according to patient size and/or use of iterative reconstruction technique. CONTRAST:  75mL OMNIPAQUE IOHEXOL 350 MG/ML SOLN COMPARISON:  CT scan head, maxillofacial and cervical spine studies 08/25/2020. No other comparison studies. FINDINGS: CT HEAD FINDINGS Brain: There is mild cerebral atrophy, small-vessel disease atrophic ventricular prominence. Cerebellum and brainstem are unremarkable. No cortical based infarct, hemorrhage or mass are seen and no mass effect. There is no midline shift. The basal cisterns are clear. Vascular: There are patchy calcifications in the siphons. There are no hyperdense central vessels. Skull: Negative for fracture or focal lesions. Plate fixation noted over the left frontal sinus, as before. Other: None.  No appreciable scalp hematoma. CT MAXILLOFACIAL FINDINGS Osseous: This study is significantly motion limited. There is no obvious displaced fracture but a subtle fracture would be missed due to the amount of motion degradation on the images. There is no mandibular dislocation. There are multifocal dental caries warranting follow-up with a dentist. There is a chronic fracture deformity of the nasal bone. Orbits: Old left orbital floor fracture mesh repair. A small amount of orbital fat is again noted as below the mesh as well as a bone fragment. No acute orbital fracture is seen. There is mild chronic  depressed fracture deformity of the left lamina papyracea. The bilateral orbital contents are unremarkable. Sinuses: There is a chronic depressed fracture of the anterior wall the left maxillary sinus with overlying fracture fixation bloating, which extends to the nasal bone. There is mild-to-moderate membrane thickening in the maxillary sinuses and mild membrane disease in the frontal, ethmoid and sphenoid sinuses. There are no sinus fluid levels or mastoid effusions. There is a left external auditory canal wax impaction. Nasal septum is S shaped. The nasal passages are unobstructed. Soft tissues: No focal soft tissue swelling. Motion artifact obscures fine detail. There are stones in the palatine tonsils.  CT CERVICAL SPINE FINDINGS Alignment: Normal. Skull base and vertebrae: Study is motion limited. No spinal compression fracture is seen. No displaced fractures evident but a subtle fracture would be missed due to motion degradation. Soft tissues and spinal canal: No prevertebral fluid or swelling. No visible canal hematoma. There calcifications at the carotid bifurcations on the left-greater-than-right. No thyroid mass. Disc levels: The cervical discs are again noted degenerated, greatest disc space loss C3-4 through C6-7. There are bidirectional osteophytes. 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 As a result, relatively mild posterior disc osteophyte complexes at C4-5, C5-6 and C6-7 are noted causing spinal canal stenosis and mild spondylotic cord compression. There is multilevel significant degenerative foraminal stenosis due to uncinate joint and facet hypertrophy, also exacerbated by the short pedicles. Similar findings were noted previously. Other: None. CT CHEST FINDINGS Cardiovascular: There is mild cardiomegaly with three-vessel coronary artery calcifications. Pulmonary arteries and veins are normal caliber. The  pulmonary arteries are centrally clear. There is no pericardial effusion. There is a cuff of hypodense material surrounding the aortic lumen beginning in the proximal aortic arch continuing down to the hiatal segment, but not continuing below the diaphragm. This is consistent with a type B intramural hematoma with similar extension of this to surround the proximal most great vessels. No mediastinal hematoma is seen. There is no intraluminal dissection. There is mild scattered aortic calcific plaque. There is no aortic aneurysm. 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 This measures 1.5 cm craniocaudal, 7 mm across the base and 4 mm depth. Mediastinum/Nodes: No enlarged mediastinal, hilar, or axillary lymph nodes. Thyroid gland, trachea, and esophagus demonstrate no significant findings. Lungs/Pleura: There is respiratory motion on exam. There are trace pleural effusions. Pneumothorax. Low inspiration on exam with moderately elevated right diaphragm. There are posterior opacities in the upper and lower lobes, in large part if not all probably due to dependent atelectasis, component of pulmonary contusions is not excluded due to the presence of rib fractures. There is diffuse bronchial thickening. This is greater in the lower lobes. There is no confluent consolidation and no nodules are seen through the breathing motion. Musculoskeletal: There are slightly displaced fractures of the left anterolateral third through seventh ribs, the left posteromedial sixth and seventh ribs and left posterior eleventh rib. On the right, there are slightly displaced fractures of the anterolateral fourth through eighth ribs and the posteromedial eleventh and twelfth ribs. No spinal fracture seen. The sternum is intact. The visualized shoulder girdles are intact. In addition, there is evidence of at least a partial tear of the  right crus of the diaphragm which is swollen and with patchy increased opacity within the muscle consistent with intramuscular hemorrhage. There is trace retrocrural hemorrhage as well. CT ABDOMEN AND PELVIS FINDINGS Hepatobiliary: No hepatic injury or perihepatic hematoma. There is a small volume of hemorrhage tracking inferior to the liver, probably originating from the diaphragmatic tear. Gallbladder is unremarkable. There is no biliary dilatation, no liver mass. Mild hepatic steatosis. Pancreas: No abnormality. Spleen: No splenic laceration is seen. There is a small volume of retroperitoneal hemorrhage extending inferior to the spleen probably tracked over from the diaphragmatic tear. Adrenals/Urinary Tract: No adrenal or perirenal hemorrhage, mass enhancement, stones or hydronephrosis. Normal bladder. Stomach/Bowel: No dilatation or wall thickening. Uncomplicated left-sided diverticula. An appendix is not  seen. Vascular/Lymphatic: There is abdominal aortic atherosclerosis. No AAA. No abdominal aortic hematoma. The portal vein opacifies normally. Reproductive: No prostatomegaly. Other: As above, a small amount hemorrhage tracks inferior to both liver and spleen, probably originating from the tear in the right diaphragmatic crus. Additional small hemoperitoneum medial to the right pararenal space and overlying the right psoas muscle. No pelvic hematoma is seen. Small amount of additional hemorrhage overlying the left psoas. There is no free air. Musculoskeletal: 3.4 cm nonexpansile sclerotic lesion in the posterior left ilium. Additional small sclerotic lesions in the right ilium. There is a 1.3 cm sclerotic lesion in the intertrochanteric proximal left femur as well. Consider bone scintigraphy for follow-up when clinically feasible. A benign process is favored but metastases are not excluded at this age. There are slightly displaced transverse process fractures on the right at L1 and L2 but no spinal compression  fracture. There are degenerative changes of the lumbar spine greatest at L4-5 where there is disc collapse and grade 1 spondylolisthesis with acquired spinal stenosis. IMPRESSION: 1. No acute intracranial CT findings or depressed skull fractures. Chronic changes. 2. Significantly motion limited maxillofacial CT without obvious displaced fractures. 3. Chronic fracture deformities of the nasal bone, left orbital floor and anterior wall of the left maxillary sinus with surgical repairs. 4. Multifocal dental caries warranting follow-up with a dentist. 5. No cervical spine fracture or listhesis.  Degenerative changes 6. 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 No intraluminal dissection or mediastinal hematoma. This extends to surround the proximal great vessels as well. Only other relevant differential would be aortitis/arteritis but this is considered much less likely. 7. Trace pleural effusions.  No pneumothorax 8. Dependent opacities in the upper and lower lobes, probably due to dependent atelectasis with component of pulmonary contusions not excluded. 9. Bronchitis. 10. Cardiomegaly with CAD and aortic and great vessel atherosclerosis. 11. Multiple bilateral rib fractures. 12. Right L1 and L2 transverse process fractures. No spinal compression fracture. 13. At least a partial tear is noted in the right diaphragmatic crus with intramuscular hemorrhage and muscular thickening, small retroperitoneal hemorrhages extending inferiorly overlying the psoas muscles, and a small amount of hemorrhagic material inferior to both the liver and spleen. No pelvic hematoma. 14. Critical Value/emergent results were called by telephone at the time of interpretation on 07/10/2023 at 3:14 am to provider [facility] , who verbally acknowledged these results. Electronically Signed   By: Almira Bar M.D.   On: 07/10/2023  03:43   CT CHEST ABDOMEN PELVIS W CONTRAST  Result Date: 07/10/2023 CLINICAL DATA:  Blunt polytrauma with the mechanism not specified. EXAM: CT HEAD WITHOUT CONTRAST CT MAXILLOFACIAL WITHOUT CONTRAST CT CERVICAL SPINE WITHOUT CONTRAST CT CHEST, ABDOMEN AND PELVIS WITH CONTRAST TECHNIQUE: Contiguous axial images were obtained from the base of the skull through the vertex without intravenous contrast. Multidetector CT imaging of the maxillofacial structures was performed. Multiplanar CT image reconstructions were also generated. A small metallic BB was placed on the right temple in order to reliably differentiate right from left. Multidetector CT imaging of the cervical spine was performed without intravenous contrast. Multiplanar CT image reconstructions were also generated. Multidetector CT imaging of the chest, abdomen and pelvis was performed following the standard protocol during bolus administration of intravenous contrast. RADIATION DOSE REDUCTION: This exam was performed according to the departmental dose-optimization program which includes automated exposure control, adjustment of the mA and/or kV according to  patient size and/or use of iterative reconstruction technique. CONTRAST:  75mL OMNIPAQUE IOHEXOL 350 MG/ML SOLN COMPARISON:  CT scan head, maxillofacial and cervical spine studies 08/25/2020. No other comparison studies. FINDINGS: CT HEAD FINDINGS Brain: There is mild cerebral atrophy, small-vessel disease atrophic ventricular prominence. Cerebellum and brainstem are unremarkable. No cortical based infarct, hemorrhage or mass are seen and no mass effect. There is no midline shift. The basal cisterns are clear. Vascular: There are patchy calcifications in the siphons. There are no hyperdense central vessels. Skull: Negative for fracture or focal lesions. Plate fixation noted over the left frontal sinus, as before. Other: None.  No appreciable scalp hematoma. CT MAXILLOFACIAL FINDINGS Osseous: This  study is significantly motion limited. There is no obvious displaced fracture but a subtle fracture would be missed due to the amount of motion degradation on the images. There is no mandibular dislocation. There are multifocal dental caries warranting follow-up with a dentist. There is a chronic fracture deformity of the nasal bone. Orbits: Old left orbital floor fracture mesh repair. A small amount of orbital fat is again noted as below the mesh as well as a bone fragment. No acute orbital fracture is seen. There is mild chronic depressed fracture deformity of the left lamina papyracea. The bilateral orbital contents are unremarkable. Sinuses: There is a chronic depressed fracture of the anterior wall the left maxillary sinus with overlying fracture fixation bloating, which extends to the nasal bone. There is mild-to-moderate membrane thickening in the maxillary sinuses and mild membrane disease in the frontal, ethmoid and sphenoid sinuses. There are no sinus fluid levels or mastoid effusions. There is a left external auditory canal wax impaction. Nasal septum is S shaped. The nasal passages are unobstructed. Soft tissues: No focal soft tissue swelling. Motion artifact obscures fine detail. There are stones in the palatine tonsils. CT CERVICAL SPINE FINDINGS Alignment: Normal. Skull base and vertebrae: Study is motion limited. No spinal compression fracture is seen. No displaced fractures evident but a subtle fracture would be missed due to motion degradation. Soft tissues and spinal canal: No prevertebral fluid or swelling. No visible canal hematoma. There calcifications at the carotid bifurcations on the left-greater-than-right. No thyroid mass. Disc levels: The cervical discs are again noted degenerated, greatest disc space loss C3-4 through C6-7. There are bidirectional osteophytes. 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 As a result, relatively mild posterior disc osteophyte complexes at C4-5, C5-6 and C6-7 are noted causing spinal canal stenosis and mild spondylotic cord compression. There is multilevel significant degenerative foraminal stenosis due to uncinate joint and facet hypertrophy, also exacerbated by the short pedicles. Similar findings were noted previously. Other: None. CT CHEST FINDINGS Cardiovascular: There is mild cardiomegaly with three-vessel coronary artery calcifications. Pulmonary arteries and veins are normal caliber. The pulmonary arteries are centrally clear. There is no pericardial effusion. There is a cuff of hypodense material surrounding the aortic lumen beginning in the proximal aortic arch continuing down to the hiatal segment, but not continuing below the diaphragm. This is consistent with a type B intramural hematoma with similar extension of this to surround the proximal most great vessels. No mediastinal hematoma is seen. There is no intraluminal dissection. There is mild scattered aortic calcific plaque. There is no aortic aneurysm. 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 This measures 1.5  cm craniocaudal, 7 mm across the base and 4 mm depth. Mediastinum/Nodes: No enlarged mediastinal, hilar, or axillary lymph nodes. Thyroid gland, trachea, and esophagus demonstrate no significant findings. Lungs/Pleura: There is respiratory motion on exam. There are trace pleural effusions. Pneumothorax. Low inspiration on exam with moderately elevated right diaphragm. There are posterior opacities in the upper and lower lobes, in large part if not all probably due to dependent atelectasis, component of pulmonary contusions is not excluded due to the presence of rib fractures. There is diffuse bronchial thickening. This is greater in the lower lobes. There is no confluent consolidation and no  nodules are seen through the breathing motion. Musculoskeletal: There are slightly displaced fractures of the left anterolateral third through seventh ribs, the left posteromedial sixth and seventh ribs and left posterior eleventh rib. On the right, there are slightly displaced fractures of the anterolateral fourth through eighth ribs and the posteromedial eleventh and twelfth ribs. No spinal fracture seen. The sternum is intact. The visualized shoulder girdles are intact. In addition, there is evidence of at least a partial tear of the right crus of the diaphragm which is swollen and with patchy increased opacity within the muscle consistent with intramuscular hemorrhage. There is trace retrocrural hemorrhage as well. CT ABDOMEN AND PELVIS FINDINGS Hepatobiliary: No hepatic injury or perihepatic hematoma. There is a small volume of hemorrhage tracking inferior to the liver, probably originating from the diaphragmatic tear. Gallbladder is unremarkable. There is no biliary dilatation, no liver mass. Mild hepatic steatosis. Pancreas: No abnormality. Spleen: No splenic laceration is seen. There is a small volume of retroperitoneal hemorrhage extending inferior to the spleen probably tracked over from the diaphragmatic tear. Adrenals/Urinary Tract: No adrenal or perirenal hemorrhage, mass enhancement, stones or hydronephrosis. Normal bladder. Stomach/Bowel: No dilatation or wall thickening. Uncomplicated left-sided diverticula. An appendix is not seen. Vascular/Lymphatic: There is abdominal aortic atherosclerosis. No AAA. No abdominal aortic hematoma. The portal vein opacifies normally. Reproductive: No prostatomegaly. Other: As above, a small amount hemorrhage tracks inferior to both liver and spleen, probably originating from the tear in the right diaphragmatic crus. Additional small hemoperitoneum medial to the right pararenal space and overlying the right psoas muscle. No pelvic hematoma is seen. Small amount of  additional hemorrhage overlying the left psoas. There is no free air. Musculoskeletal: 3.4 cm nonexpansile sclerotic lesion in the posterior left ilium. Additional small sclerotic lesions in the right ilium. There is a 1.3 cm sclerotic lesion in the intertrochanteric proximal left femur as well. Consider bone scintigraphy for follow-up when clinically feasible. A benign process is favored but metastases are not excluded at this age. There are slightly displaced transverse process fractures on the right at L1 and L2 but no spinal compression fracture. There are degenerative changes of the lumbar spine greatest at L4-5 where there is disc collapse and grade 1 spondylolisthesis with acquired spinal stenosis. IMPRESSION: 1. No acute intracranial CT findings or depressed skull fractures. Chronic changes. 2. Significantly motion limited maxillofacial CT without obvious displaced fractures. 3. Chronic fracture deformities of the nasal bone, left orbital floor and anterior wall of the left maxillary sinus with surgical repairs. 4. Multifocal dental caries warranting follow-up with a dentist. 5. No cervical spine fracture or listhesis.  Degenerative changes 6. 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 No intraluminal dissection or mediastinal hematoma. This extends to surround the proximal great vessels as well. Only other relevant differential would  be aortitis/arteritis but this is considered much less likely. 7. Trace pleural effusions.  No pneumothorax 8. Dependent opacities in the upper and lower lobes, probably due to dependent atelectasis with component of pulmonary contusions not excluded. 9. Bronchitis. 10. Cardiomegaly with CAD and aortic and great vessel atherosclerosis. 11. Multiple bilateral rib fractures. 12. Right L1 and L2 transverse process fractures. No spinal compression fracture. 13. At least a  partial tear is noted in the right diaphragmatic crus with intramuscular hemorrhage and muscular thickening, small retroperitoneal hemorrhages extending inferiorly overlying the psoas muscles, and a small amount of hemorrhagic material inferior to both the liver and spleen. No pelvic hematoma. 14. Critical Value/emergent results were called by telephone at the time of interpretation on 07/10/2023 at 3:14 am to provider [facility] , who verbally acknowledged these results. Electronically Signed   By: Almira Bar M.D.   On: 07/10/2023 03:43   DG Chest Port 1 View  Result Date: 07/10/2023 CLINICAL DATA:  Status post trauma. EXAM: PORTABLE CHEST 1 VIEW COMPARISON:  None Available. FINDINGS: The cardiac silhouette is mildly enlarged. Low lung volumes are seen with subsequent crowding of the bronchovascular lung markings. Mild bilateral infrahilar atelectatic changes are seen. No pleural effusion or pneumothorax is identified. No acute osseous abnormalities are identified. IMPRESSION: Low lung volumes with mild bilateral infrahilar atelectasis. Electronically Signed   By: Aram Candela M.D.   On: 07/10/2023 02:41   CT T-SPINE NO CHARGE  Result Date: 07/10/2023 CLINICAL DATA:  Trauma EXAM: CT Thoracic and Lumbar spine without contrast TECHNIQUE: Multiplanar CT images of the thoracic and lumbar spine were reconstructed from contemporary CT of the Chest, Abdomen, and Pelvis. RADIATION DOSE REDUCTION: This exam was performed according to the departmental dose-optimization program which includes automated exposure control, adjustment of the mA and/or kV according to patient size and/or use of iterative reconstruction technique. CONTRAST:  No additional contrast COMPARISON:  None Available. FINDINGS: CT THORACIC SPINE FINDINGS Alignment: Normal. Vertebrae: There is widening of the T11-12 disc space with a fracture of the anterior inferior corner of T11. Disc levels: No spinal canal stenosis CT LUMBAR  SPINE FINDINGS Segmentation: 5 lumbar type vertebrae. Alignment: Grade 1 anterolisthesis at L4-5 Vertebrae: Right transverse process fractures at L1, L2 and L4 Disc levels: Multilevel facet arthrosis. Severe bilateral L4 foraminal stenosis. IMPRESSION: 1. Widening of the T11-12 disc space with a fracture of the anterior inferior corner of T11. This is concerning for ligamentous injury and MRI of the thoracic spine is recommended when the patient is stable. 2. Right transverse process fractures at L1, L2 and L4. 3. Please see report for CT chest, abdomen and pelvis for discussion of other acute findings. Electronically Signed   By: Deatra Robinson M.D.   On: 07/10/2023 02:39   CT L-SPINE NO CHARGE  Result Date: 07/10/2023 CLINICAL DATA:  Trauma EXAM: CT Thoracic and Lumbar spine without contrast TECHNIQUE: Multiplanar CT images of the thoracic and lumbar spine were reconstructed from contemporary CT of the Chest, Abdomen, and Pelvis. RADIATION DOSE REDUCTION: This exam was performed according to the departmental dose-optimization program which includes automated exposure control, adjustment of the mA and/or kV according to patient size and/or use of iterative reconstruction technique. CONTRAST:  No additional contrast COMPARISON:  None Available. FINDINGS: CT THORACIC SPINE FINDINGS Alignment: Normal. Vertebrae: There is widening of the T11-12 disc space with a fracture of the anterior inferior corner of T11. Disc levels: No spinal canal stenosis CT LUMBAR SPINE FINDINGS Segmentation: 5 lumbar type  vertebrae. Alignment: Grade 1 anterolisthesis at L4-5 Vertebrae: Right transverse process fractures at L1, L2 and L4 Disc levels: Multilevel facet arthrosis. Severe bilateral L4 foraminal stenosis. IMPRESSION: 1. Widening of the T11-12 disc space with a fracture of the anterior inferior corner of T11. This is concerning for ligamentous injury and MRI of the thoracic spine is recommended when the patient is stable. 2.  Right transverse process fractures at L1, L2 and L4. 3. Please see report for CT chest, abdomen and pelvis for discussion of other acute findings. Electronically Signed   By: Deatra Robinson M.D.   On: 07/10/2023 02:39   DG Pelvis Portable  Result Date: 07/10/2023 CLINICAL DATA:  Status post trauma. EXAM: PORTABLE PELVIS 1-2 VIEWS COMPARISON:  None Available. FINDINGS: There is no evidence of acute pelvic fracture or diastasis. A 1.7 cm x 1.9 cm sclerotic focus is seen overlying the inter trochanteric region of the proximal left femur. IMPRESSION: 1. No acute pelvic fracture. 2. Sclerotic focus within the proximal left femur, likely representing a benign bone island. Electronically Signed   By: Aram Candela M.D.   On: 07/10/2023 02:01    Review of Systems Blood pressure 108/64, pulse 83, temperature 98.3 F (36.8 C), temperature source Oral, resp. rate 19, height 5\' 10"  (1.778 m), weight 97.5 kg, SpO2 98%. Physical Exam Constitutional:      Appearance: He is obese.  HENT:     Head: Normocephalic.     Nose: Nose normal.     Mouth/Throat:     Mouth: Mucous membranes are moist.     Pharynx: Oropharynx is clear.  Eyes:     Extraocular Movements: Extraocular movements intact.     Pupils: Pupils are equal, round, and reactive to light.  Cardiovascular:     Rate and Rhythm: Normal rate and regular rhythm.  Pulmonary:     Effort: Pulmonary effort is normal.  Abdominal:     General: Abdomen is flat.  Musculoskeletal:     Cervical back: Normal range of motion.  Skin:    General: Skin is warm and dry.  Neurological:     Mental Status: He is alert and oriented to person, place, and time.     GCS: GCS eye subscore is 4. GCS verbal subscore is 5. GCS motor subscore is 6.     Cranial Nerves: Cranial nerves 2-12 are intact.     Sensory: Sensation is intact.     Motor: Motor function is intact.     Coordination: Coordination is intact.     Deep Tendon Reflexes: Babinski sign absent on the  right side. Babinski sign absent on the left side.     Assessment/Plan: No brace needed. No further workup indicated. The ligamentous injury if present does not render him unstable.  Transverse process fractures will take care of themselves.will follow  Jerry Daniels 07/10/2023, 1:39 PM

## 2023-07-10 NOTE — ED Notes (Signed)
[id number] the pt had fentanyl somehow the scans did not go through

## 2023-07-10 NOTE — ED Provider Notes (Signed)
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 dependent atelectasis with component of pulmonary contusions not excluded. 9. Bronchitis. 10. Cardiomegaly with CAD and aortic and great vessel atherosclerosis. 11. Multiple bilateral rib fractures. 12. Right L1 and L2 transverse process fractures. No spinal compression fracture. 13. At least a partial tear is noted in the right diaphragmatic crus with intramuscular hemorrhage and muscular thickening, small retroperitoneal hemorrhages extending inferiorly overlying the psoas muscles, and a small amount of hemorrhagic material inferior to both the liver and spleen. No pelvic hematoma. 14. Critical Value/emergent results were called by telephone at the time of interpretation on 07/10/2023 at 3:14 am to provider [facility] , who verbally acknowledged these results. Electronically Signed   By: Almira Bar M.D.   On: 07/10/2023 03:43   CT CERVICAL SPINE WO CONTRAST  Result Date: 07/10/2023 CLINICAL DATA:  Blunt polytrauma with the mechanism not specified. EXAM: CT HEAD WITHOUT CONTRAST CT MAXILLOFACIAL WITHOUT CONTRAST CT CERVICAL SPINE WITHOUT CONTRAST CT CHEST, ABDOMEN AND PELVIS WITH CONTRAST TECHNIQUE: Contiguous axial images were obtained from the  base of the skull through the vertex without intravenous contrast. Multidetector CT imaging of the maxillofacial structures was performed. Multiplanar CT image reconstructions were also generated. A small metallic BB was placed on the right temple in order to reliably differentiate right from left. Multidetector CT imaging of the cervical spine was performed without intravenous contrast. Multiplanar CT image reconstructions were also generated. Multidetector CT imaging of the chest, abdomen and pelvis was performed following the standard protocol during bolus administration of intravenous contrast. RADIATION DOSE REDUCTION: This exam was performed according to the departmental dose-optimization program which includes automated exposure control, adjustment of the mA and/or kV according to patient size and/or use of iterative reconstruction technique. CONTRAST:  75mL OMNIPAQUE IOHEXOL 350 MG/ML SOLN COMPARISON:  CT scan head, maxillofacial and cervical spine studies 08/25/2020. No other comparison studies. FINDINGS: CT HEAD FINDINGS Brain: There is mild cerebral atrophy, small-vessel disease atrophic ventricular prominence. Cerebellum and brainstem are unremarkable. No cortical based infarct, hemorrhage or mass are seen and no mass effect. There is no midline shift. The basal cisterns are clear. Vascular: There are patchy calcifications in the siphons. There are no hyperdense central vessels. Skull: Negative for fracture or focal lesions. Plate fixation noted over the left frontal sinus, as before. Other: None.  No  appreciable scalp hematoma. CT MAXILLOFACIAL FINDINGS Osseous: This study is significantly motion limited. There is no obvious displaced fracture but a subtle fracture would be missed due to the amount of motion degradation on the images. There is no mandibular dislocation. There are multifocal dental caries warranting follow-up with a dentist. There is a chronic fracture deformity of the nasal bone.  Orbits: Old left orbital floor fracture mesh repair. A small amount of orbital fat is again noted as below the mesh as well as a bone fragment. No acute orbital fracture is seen. There is mild chronic depressed fracture deformity of the left lamina papyracea. The bilateral orbital contents are unremarkable. Sinuses: There is a chronic depressed fracture of the anterior wall the left maxillary sinus with overlying fracture fixation bloating, which extends to the nasal bone. There is mild-to-moderate membrane thickening in the maxillary sinuses and mild membrane disease in the frontal, ethmoid and sphenoid sinuses. There are no sinus fluid levels or mastoid effusions. There is a left external auditory canal wax impaction. Nasal septum is S shaped. The nasal passages are unobstructed. Soft tissues: No focal soft tissue swelling. Motion artifact obscures fine detail. There are stones in the palatine tonsils. CT CERVICAL SPINE FINDINGS Alignment: Normal. Skull base and vertebrae: Study is motion limited. No spinal compression fracture is seen. No displaced fractures evident but a subtle fracture would be missed due to motion degradation. Soft tissues and spinal canal: No prevertebral fluid or swelling. No visible canal hematoma. There calcifications at the carotid bifurcations on the left-greater-than-right. No thyroid mass. Disc levels: The cervical discs are again noted degenerated, greatest disc space loss C3-4 through C6-7. There are bidirectional osteophytes. 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 As a result, relatively mild posterior disc osteophyte complexes at C4-5, C5-6 and C6-7 are noted causing spinal canal stenosis and mild spondylotic cord compression. There is multilevel significant degenerative foraminal stenosis due to uncinate joint and facet hypertrophy, also exacerbated by the short pedicles. Similar findings  were noted previously. Other: None. CT CHEST FINDINGS Cardiovascular: There is mild cardiomegaly with three-vessel coronary artery calcifications. Pulmonary arteries and veins are normal caliber. The pulmonary arteries are centrally clear. There is no pericardial effusion. There is a cuff of hypodense material surrounding the aortic lumen beginning in the proximal aortic arch continuing down to the hiatal segment, but not continuing below the diaphragm. This is consistent with a type B intramural hematoma with similar extension of this to surround the proximal most great vessels. No mediastinal hematoma is seen. There is no intraluminal dissection. There is mild scattered aortic calcific plaque. There is no aortic aneurysm. 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 This measures 1.5 cm craniocaudal, 7 mm across the base and 4 mm depth. Mediastinum/Nodes: No enlarged mediastinal, hilar, or axillary lymph nodes. Thyroid gland, trachea, and esophagus demonstrate no significant findings. Lungs/Pleura: There is respiratory motion on exam. There are trace pleural effusions. Pneumothorax. Low inspiration on exam with moderately elevated right diaphragm. There are posterior opacities in the upper and lower lobes, in large part if not all probably due to dependent atelectasis, component of pulmonary contusions is not excluded due to the presence of rib fractures. There is diffuse bronchial thickening. This is greater in the lower lobes. There is no confluent consolidation and no nodules are seen through  the breathing motion. Musculoskeletal: There are slightly displaced fractures of the left anterolateral third through seventh ribs, the left posteromedial sixth and seventh ribs and left posterior eleventh rib. On the right, there are slightly displaced fractures of the anterolateral fourth through eighth ribs and the  posteromedial eleventh and twelfth ribs. No spinal fracture seen. The sternum is intact. The visualized shoulder girdles are intact. In addition, there is evidence of at least a partial tear of the right crus of the diaphragm which is swollen and with patchy increased opacity within the muscle consistent with intramuscular hemorrhage. There is trace retrocrural hemorrhage as well. CT ABDOMEN AND PELVIS FINDINGS Hepatobiliary: No hepatic injury or perihepatic hematoma. There is a small volume of hemorrhage tracking inferior to the liver, probably originating from the diaphragmatic tear. Gallbladder is unremarkable. There is no biliary dilatation, no liver mass. Mild hepatic steatosis. Pancreas: No abnormality. Spleen: No splenic laceration is seen. There is a small volume of retroperitoneal hemorrhage extending inferior to the spleen probably tracked over from the diaphragmatic tear. Adrenals/Urinary Tract: No adrenal or perirenal hemorrhage, mass enhancement, stones or hydronephrosis. Normal bladder. Stomach/Bowel: No dilatation or wall thickening. Uncomplicated left-sided diverticula. An appendix is not seen. Vascular/Lymphatic: There is abdominal aortic atherosclerosis. No AAA. No abdominal aortic hematoma. The portal vein opacifies normally. Reproductive: No prostatomegaly. Other: As above, a small amount hemorrhage tracks inferior to both liver and spleen, probably originating from the tear in the right diaphragmatic crus. Additional small hemoperitoneum medial to the right pararenal space and overlying the right psoas muscle. No pelvic hematoma is seen. Small amount of additional hemorrhage overlying the left psoas. There is no free air. Musculoskeletal: 3.4 cm nonexpansile sclerotic lesion in the posterior left ilium. Additional small sclerotic lesions in the right ilium. There is a 1.3 cm sclerotic lesion in the intertrochanteric proximal left femur as well. Consider bone scintigraphy for follow-up when  clinically feasible. A benign process is favored but metastases are not excluded at this age. There are slightly displaced transverse process fractures on the right at L1 and L2 but no spinal compression fracture. There are degenerative changes of the lumbar spine greatest at L4-5 where there is disc collapse and grade 1 spondylolisthesis with acquired spinal stenosis. IMPRESSION: 1. No acute intracranial CT findings or depressed skull fractures. Chronic changes. 2. Significantly motion limited maxillofacial CT without obvious displaced fractures. 3. Chronic fracture deformities of the nasal bone, left orbital floor and anterior wall of the left maxillary sinus with surgical repairs. 4. Multifocal dental caries warranting follow-up with a dentist. 5. No cervical spine fracture or listhesis.  Degenerative changes 6. 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 No intraluminal dissection or mediastinal hematoma. This extends to surround the proximal great vessels as well. Only other relevant differential would be aortitis/arteritis but this is considered much less likely. 7. Trace pleural effusions.  No pneumothorax 8. Dependent opacities in the upper and lower lobes, probably due to dependent atelectasis with component of pulmonary contusions not excluded. 9. Bronchitis. 10. Cardiomegaly with CAD and aortic and great vessel atherosclerosis. 11. Multiple bilateral rib fractures. 12. Right L1 and L2 transverse process fractures. No spinal compression fracture. 13. At least a partial tear is noted in the right diaphragmatic crus with intramuscular hemorrhage and muscular thickening, small retroperitoneal hemorrhages extending inferiorly overlying the psoas muscles, and a small amount of hemorrhagic material inferior to both the liver and spleen. No  pelvic hematoma. 14. Critical Value/emergent results were called by  telephone at the time of interpretation on 07/10/2023 at 3:14 am to provider [facility] , who verbally acknowledged these results. Electronically Signed   By: Almira Bar M.D.   On: 07/10/2023 03:43   CT CHEST ABDOMEN PELVIS W CONTRAST  Result Date: 07/10/2023 CLINICAL DATA:  Blunt polytrauma with the mechanism not specified. EXAM: CT HEAD WITHOUT CONTRAST CT MAXILLOFACIAL WITHOUT CONTRAST CT CERVICAL SPINE WITHOUT CONTRAST CT CHEST, ABDOMEN AND PELVIS WITH CONTRAST TECHNIQUE: Contiguous axial images were obtained from the base of the skull through the vertex without intravenous contrast. Multidetector CT imaging of the maxillofacial structures was performed. Multiplanar CT image reconstructions were also generated. A small metallic BB was placed on the right temple in order to reliably differentiate right from left. Multidetector CT imaging of the cervical spine was performed without intravenous contrast. Multiplanar CT image reconstructions were also generated. Multidetector CT imaging of the chest, abdomen and pelvis was performed following the standard protocol during bolus administration of intravenous contrast. RADIATION DOSE REDUCTION: This exam was performed according to the departmental dose-optimization program which includes automated exposure control, adjustment of the mA and/or kV according to patient size and/or use of iterative reconstruction technique. CONTRAST:  75mL OMNIPAQUE IOHEXOL 350 MG/ML SOLN COMPARISON:  CT scan head, maxillofacial and cervical spine studies 08/25/2020. No other comparison studies. FINDINGS: CT HEAD FINDINGS Brain: There is mild cerebral atrophy, small-vessel disease atrophic ventricular prominence. Cerebellum and brainstem are unremarkable. No cortical based infarct, hemorrhage or mass are seen and no mass effect. There is no midline shift. The basal cisterns are clear. Vascular: There are patchy calcifications in the siphons. There are no hyperdense central  vessels. Skull: Negative for fracture or focal lesions. Plate fixation noted over the left frontal sinus, as before. Other: None.  No appreciable scalp hematoma. CT MAXILLOFACIAL FINDINGS Osseous: This study is significantly motion limited. There is no obvious displaced fracture but a subtle fracture would be missed due to the amount of motion degradation on the images. There is no mandibular dislocation. There are multifocal dental caries warranting follow-up with a dentist. There is a chronic fracture deformity of the nasal bone. Orbits: Old left orbital floor fracture mesh repair. A small amount of orbital fat is again noted as below the mesh as well as a bone fragment. No acute orbital fracture is seen. There is mild chronic depressed fracture deformity of the left lamina papyracea. The bilateral orbital contents are unremarkable. Sinuses: There is a chronic depressed fracture of the anterior wall the left maxillary sinus with overlying fracture fixation bloating, which extends to the nasal bone. There is mild-to-moderate membrane thickening in the maxillary sinuses and mild membrane disease in the frontal, ethmoid and sphenoid sinuses. There are no sinus fluid levels or mastoid effusions. There is a left external auditory canal wax impaction. Nasal septum is S shaped. The nasal passages are unobstructed. Soft tissues: No focal soft tissue swelling. Motion artifact obscures fine detail. There are stones in the palatine tonsils. CT CERVICAL SPINE FINDINGS Alignment: Normal. Skull base and vertebrae: Study is motion limited. No spinal compression fracture is seen. No displaced fractures evident but a subtle fracture would be missed due to motion degradation. Soft tissues and spinal canal: No prevertebral fluid or swelling. No visible canal hematoma. There calcifications at the carotid bifurcations on the left-greater-than-right. No thyroid mass. Disc levels: The cervical discs are again noted degenerated, greatest  disc space loss C3-4 through C6-7. There  are bidirectional osteophytes. 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 As a result, relatively mild posterior disc osteophyte complexes at C4-5, C5-6 and C6-7 are noted causing spinal canal stenosis and mild spondylotic cord compression. There is multilevel significant degenerative foraminal stenosis due to uncinate joint and facet hypertrophy, also exacerbated by the short pedicles. Similar findings were noted previously. Other: None. CT CHEST FINDINGS Cardiovascular: There is mild cardiomegaly with three-vessel coronary artery calcifications. Pulmonary arteries and veins are normal caliber. The pulmonary arteries are centrally clear. There is no pericardial effusion. There is a cuff of hypodense material surrounding the aortic lumen beginning in the proximal aortic arch continuing down to the hiatal segment, but not continuing below the diaphragm. This is consistent with a type B intramural hematoma with similar extension of this to surround the proximal most great vessels. No mediastinal hematoma is seen. There is no intraluminal dissection. There is mild scattered aortic calcific plaque. There is no aortic aneurysm. 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 This measures 1.5 cm craniocaudal, 7 mm across the base and 4 mm depth. Mediastinum/Nodes: No enlarged mediastinal, hilar, or axillary lymph nodes. Thyroid gland, trachea, and esophagus demonstrate no significant findings. Lungs/Pleura: There is respiratory motion on exam. There are trace pleural effusions. Pneumothorax. Low inspiration on exam with moderately elevated right diaphragm. There are posterior opacities in the upper and lower lobes, in large part if not all probably due to dependent atelectasis, component of  pulmonary contusions is not excluded due to the presence of rib fractures. There is diffuse bronchial thickening. This is greater in the lower lobes. There is no confluent consolidation and no nodules are seen through the breathing motion. Musculoskeletal: There are slightly displaced fractures of the left anterolateral third through seventh ribs, the left posteromedial sixth and seventh ribs and left posterior eleventh rib. On the right, there are slightly displaced fractures of the anterolateral fourth through eighth ribs and the posteromedial eleventh and twelfth ribs. No spinal fracture seen. The sternum is intact. The visualized shoulder girdles are intact. In addition, there is evidence of at least a partial tear of the right crus of the diaphragm which is swollen and with patchy increased opacity within the muscle consistent with intramuscular hemorrhage. There is trace retrocrural hemorrhage as well. CT ABDOMEN AND PELVIS FINDINGS Hepatobiliary: No hepatic injury or perihepatic hematoma. There is a small volume of hemorrhage tracking inferior to the liver, probably originating from the diaphragmatic tear. Gallbladder is unremarkable. There is no biliary dilatation, no liver mass. Mild hepatic steatosis. Pancreas: No abnormality. Spleen: No splenic laceration is seen. There is a small volume of retroperitoneal hemorrhage extending inferior to the spleen probably tracked over from the diaphragmatic tear. Adrenals/Urinary Tract: No adrenal or perirenal hemorrhage, mass enhancement, stones or hydronephrosis. Normal bladder. Stomach/Bowel: No dilatation or wall thickening. Uncomplicated left-sided diverticula. An appendix is not seen. Vascular/Lymphatic: There is abdominal aortic atherosclerosis. No AAA. No abdominal aortic hematoma. The portal vein opacifies normally. Reproductive: No prostatomegaly. Other: As above, a small amount hemorrhage tracks inferior to both liver and spleen, probably originating from  the tear in the right diaphragmatic crus. Additional small hemoperitoneum medial to the right pararenal space and overlying the right psoas muscle. No pelvic hematoma is seen. Small amount of additional hemorrhage overlying the left psoas. There is no free  air. Musculoskeletal: 3.4 cm nonexpansile sclerotic lesion in the posterior left ilium. Additional small sclerotic lesions in the right ilium. There is a 1.3 cm sclerotic lesion in the intertrochanteric proximal left femur as well. Consider bone scintigraphy for follow-up when clinically feasible. A benign process is favored but metastases are not excluded at this age. There are slightly displaced transverse process fractures on the right at L1 and L2 but no spinal compression fracture. There are degenerative changes of the lumbar spine greatest at L4-5 where there is disc collapse and grade 1 spondylolisthesis with acquired spinal stenosis. IMPRESSION: 1. No acute intracranial CT findings or depressed skull fractures. Chronic changes. 2. Significantly motion limited maxillofacial CT without obvious displaced fractures. 3. Chronic fracture deformities of the nasal bone, left orbital floor and anterior wall of the left maxillary sinus with surgical repairs. 4. Multifocal dental caries warranting follow-up with a dentist. 5. No cervical spine fracture or listhesis.  Degenerative changes 6. 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 No intraluminal dissection or mediastinal hematoma. This extends to surround the proximal great vessels as well. Only other relevant differential would be aortitis/arteritis but this is considered much less likely. 7. Trace pleural effusions.  No pneumothorax 8. Dependent opacities in the upper and lower lobes, probably due to dependent atelectasis with component of pulmonary contusions not excluded. 9. Bronchitis. 10. Cardiomegaly  with CAD and aortic and great vessel atherosclerosis. 11. Multiple bilateral rib fractures. 12. Right L1 and L2 transverse process fractures. No spinal compression fracture. 13. At least a partial tear is noted in the right diaphragmatic crus with intramuscular hemorrhage and muscular thickening, small retroperitoneal hemorrhages extending inferiorly overlying the psoas muscles, and a small amount of hemorrhagic material inferior to both the liver and spleen. No pelvic hematoma. 14. Critical Value/emergent results were called by telephone at the time of interpretation on 07/10/2023 at 3:14 am to provider [facility] , who verbally acknowledged these results. Electronically Signed   By: Almira Bar M.D.   On: 07/10/2023 03:43   DG Chest Port 1 View  Result Date: 07/10/2023 CLINICAL DATA:  Status post trauma. EXAM: PORTABLE CHEST 1 VIEW COMPARISON:  None Available. FINDINGS: The cardiac silhouette is mildly enlarged. Low lung volumes are seen with subsequent crowding of the bronchovascular lung markings. Mild bilateral infrahilar atelectatic changes are seen. No pleural effusion or pneumothorax is identified. No acute osseous abnormalities are identified. IMPRESSION: Low lung volumes with mild bilateral infrahilar atelectasis. Electronically Signed   By: Aram Candela M.D.   On: 07/10/2023 02:41   CT T-SPINE NO CHARGE  Result Date: 07/10/2023 CLINICAL DATA:  Trauma EXAM: CT Thoracic and Lumbar spine without contrast TECHNIQUE: Multiplanar CT images of the thoracic and lumbar spine were reconstructed from contemporary CT of the Chest, Abdomen, and Pelvis. RADIATION DOSE REDUCTION: This exam was performed according to the departmental dose-optimization program which includes automated exposure control, adjustment of the mA and/or kV according to patient size and/or use of iterative reconstruction technique. CONTRAST:  No additional contrast COMPARISON:  None Available. FINDINGS: CT THORACIC SPINE  FINDINGS Alignment: Normal. Vertebrae: There is widening of the T11-12 disc space with a fracture of the anterior inferior corner of T11. Disc levels: No spinal canal stenosis CT LUMBAR SPINE FINDINGS Segmentation: 5 lumbar type vertebrae. Alignment: Grade 1 anterolisthesis at L4-5 Vertebrae: Right transverse process fractures at L1, L2 and L4 Disc levels: Multilevel facet arthrosis. Severe bilateral  L4 foraminal stenosis. IMPRESSION: 1. Widening of the T11-12 disc space with a fracture of the anterior inferior corner of T11. This is concerning for ligamentous injury and MRI of the thoracic spine is recommended when the patient is stable. 2. Right transverse process fractures at L1, L2 and L4. 3. Please see report for CT chest, abdomen and pelvis for discussion of other acute findings. Electronically Signed   By: Deatra Robinson M.D.   On: 07/10/2023 02:39   CT L-SPINE NO CHARGE  Result Date: 07/10/2023 CLINICAL DATA:  Trauma EXAM: CT Thoracic and Lumbar spine without contrast TECHNIQUE: Multiplanar CT images of the thoracic and lumbar spine were reconstructed from contemporary CT of the Chest, Abdomen, and Pelvis. RADIATION DOSE REDUCTION: This exam was performed according to the departmental dose-optimization program which includes automated exposure control, adjustment of the mA and/or kV according to patient size and/or use of iterative reconstruction technique. CONTRAST:  No additional contrast

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
## 2023-07-11 ENCOUNTER — Inpatient Hospital Stay (HOSPITAL_COMMUNITY): Payer: PPO

## 2023-07-11 ENCOUNTER — Other Ambulatory Visit: Payer: Self-pay | Admitting: *Deleted

## 2023-07-11 ENCOUNTER — Encounter (HOSPITAL_COMMUNITY): Payer: Self-pay

## 2023-07-11 DIAGNOSIS — S2500XA Unspecified injury of thoracic aorta, initial encounter: Secondary | ICD-10-CM

## 2023-07-11 LAB — BASIC METABOLIC PANEL
Anion gap: 7 (ref 5–15)
BUN: 29 mg/dL — ABNORMAL HIGH (ref 8–23)
CO2: 19 mmol/L — ABNORMAL LOW (ref 22–32)
Calcium: 7.8 mg/dL — ABNORMAL LOW (ref 8.9–10.3)
Chloride: 107 mmol/L (ref 98–111)
Creatinine, Ser: 1.66 mg/dL — ABNORMAL HIGH (ref 0.61–1.24)
GFR, Estimated: 45 mL/min — ABNORMAL LOW (ref >60)
Glucose, Bld: 131 mg/dL — ABNORMAL HIGH (ref 70–99)
Potassium: 5 mmol/L (ref 3.5–5.1)
Sodium: 133 mmol/L — ABNORMAL LOW (ref 135–145)

## 2023-07-11 LAB — COMPREHENSIVE METABOLIC PANEL
ALT: 162 U/L — ABNORMAL HIGH (ref 0–44)
AST: 199 U/L — ABNORMAL HIGH (ref 15–41)
Albumin: 3.1 g/dL — ABNORMAL LOW (ref 3.5–5.0)
Alkaline Phosphatase: 63 U/L (ref 38–126)
Anion gap: 5 (ref 5–15)
BUN: 33 mg/dL — ABNORMAL HIGH (ref 8–23)
CO2: 21 mmol/L — ABNORMAL LOW (ref 22–32)
Calcium: 7.8 mg/dL — ABNORMAL LOW (ref 8.9–10.3)
Chloride: 108 mmol/L (ref 98–111)
Creatinine, Ser: 2.4 mg/dL — ABNORMAL HIGH (ref 0.61–1.24)
GFR, Estimated: 29 mL/min — ABNORMAL LOW (ref >60)
Glucose, Bld: 136 mg/dL — ABNORMAL HIGH (ref 70–99)
Potassium: 5.6 mmol/L — ABNORMAL HIGH (ref 3.5–5.1)
Sodium: 134 mmol/L — ABNORMAL LOW (ref 135–145)
Total Bilirubin: 1.6 mg/dL — ABNORMAL HIGH (ref 0.3–1.2)
Total Protein: 6 g/dL — ABNORMAL LOW (ref 6.5–8.1)

## 2023-07-11 MED ORDER — PHENOBARBITAL 32.4 MG PO TABS: 64.8000 mg | ORAL_TABLET | Freq: Three times a day (TID) | ORAL | Status: DC

## 2023-07-11 MED ORDER — SODIUM ZIRCONIUM CYCLOSILICATE 10 G PO PACK
10.0000 g | PACK | Freq: Once | ORAL | Status: AC
  Administered 2023-07-11: 10 g via ORAL
  Filled 2023-07-11: qty 1

## 2023-07-11 MED ORDER — POLYETHYLENE GLYCOL 3350 17 G PO PACK
17.0000 g | PACK | Freq: Every day | ORAL | Status: DC
  Administered 2023-07-11 – 2023-07-14 (×4): 17 g via ORAL
  Filled 2023-07-11 (×4): qty 1

## 2023-07-11 MED ORDER — LORAZEPAM 2 MG/ML IJ SOLN: 1.0000 mg | INTRAMUSCULAR | Status: DC | PRN

## 2023-07-11 MED ORDER — IPRATROPIUM-ALBUTEROL 0.5-2.5 (3) MG/3ML IN SOLN
3.0000 mL | RESPIRATORY_TRACT | Status: AC
  Administered 2023-07-11 – 2023-07-14 (×18): 3 mL via RESPIRATORY_TRACT
  Filled 2023-07-11 (×18): qty 3

## 2023-07-11 MED ORDER — PHENOBARBITAL 32.4 MG PO TABS
97.2000 mg | ORAL_TABLET | Freq: Three times a day (TID) | ORAL | Status: DC
  Administered 2023-07-11 – 2023-07-12 (×4): 97.2 mg via ORAL
  Filled 2023-07-11 (×4): qty 3

## 2023-07-11 MED ORDER — LIDOCAINE 5 % EX PTCH
2.0000 | MEDICATED_PATCH | CUTANEOUS | Status: DC
  Administered 2023-07-11 – 2023-07-17 (×6): 2 via TRANSDERMAL
  Filled 2023-07-11 (×7): qty 2

## 2023-07-11 MED ORDER — SODIUM CHLORIDE 0.9 % IV BOLUS
1000.0000 mL | Freq: Once | INTRAVENOUS | Status: AC
  Administered 2023-07-11: 1000 mL via INTRAVENOUS

## 2023-07-11 MED ORDER — PHENOBARBITAL 32.4 MG PO TABS: 32.4000 mg | ORAL_TABLET | Freq: Three times a day (TID) | ORAL | Status: DC

## 2023-07-11 MED ORDER — HEPARIN SODIUM (PORCINE) 5000 UNIT/ML IJ SOLN
5000.0000 [IU] | Freq: Three times a day (TID) | INTRAMUSCULAR | Status: DC
  Administered 2023-07-11 – 2023-07-12 (×4): 5000 [IU] via SUBCUTANEOUS
  Filled 2023-07-11 (×4): qty 1
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
## 2023-07-11 NOTE — Progress Notes (Signed)
Large difference in BMP labs this morning, Dr. Fredricka Bonine aware, CMP ordered to ensure labs values are correct.

## 2023-07-11 NOTE — TOC CAGE-AID Note (Signed)
Transition of Care [facility]) - CAGE-AID Screening   Patient Details  Name: Jerry Daniels MRN: [id number] Date of Birth: [birth year]  [facility]) CM/SW Contact:    Katha Hamming, RN Phone Number: 07/11/2023, 8:10 PM   Clinical Narrative:    CAGE-AID Screening:    Have You Ever Felt You Ought to Cut Down on Your Drinking or Drug Use?: Yes Have People Annoyed You By Critizing Your Drinking Or Drug Use?: Yes Have You Felt Bad Or Guilty About Your Drinking Or Drug Use?: Yes Have You Ever Had a Drink or Used Drugs First Thing In The Morning to Steady Your Nerves or to Get Rid of a Hangover?: Yes CAGE-AID Score: 4  Substance Abuse Education Offered: Yes (increased alcohol consumption since death of wife, TOC consult already in place for resources)

## 2023-07-11 NOTE — Progress Notes (Signed)
  VASCULAR SURGERY ASSESSMENT & PLAN:   INTRAMURAL HEMATOMA THORACIC AORTA: I have reviewed the films with my partners.  This patient has an extensive intramural hematoma along the thoracic aorta.  This is likely related to a small defect in the mid descending thoracic aorta.  My plan was for a follow-up CT scan tomorrow but given his issues with renal insufficiency we will not be able to do that.  Given the mechanism of injury and this finding on CT I think the safest approach would be to proceed with coverage of this area of the thoracic aorta with a thoracic stent.  This is scheduled for Friday.  We will follow his renal function closely.  If his renal function does not improve this could potentially be done with intravascular ultrasound.  SUBJECTIVE:   His back pain is better controlled now.  PHYSICAL EXAM:   Vitals:   07/11/23 0440 07/11/23 0500 07/11/23 0600 07/11/23 0800  BP:  128/87 (!) 146/93   Pulse:  84 94   Resp:  12 (!) 26   Temp: 98.3 F (36.8 C)   99.4 F (37.4 C)  TempSrc: Oral   Axillary  SpO2:  95% 95%   Weight:      Height:       He has palpable pedal pulses.  LABS:   Lab Results  Component Value Date   WBC 17.2 (H) 07/11/2023   HGB 13.1 07/11/2023   HCT 39.6 07/11/2023   MCV 99.7 07/11/2023   PLT 241 07/11/2023   Lab Results  Component Value Date   CREATININE 2.40 (H) 07/11/2023   Lab Results  Component Value Date   INR 1.3 (H) 07/10/2023   PROBLEM LIST:    Principal Problem:   Thoracic aorta injury  CURRENT MEDS:    acetaminophen  1,000 mg Oral Q6H   Chlorhexidine Gluconate Cloth  6 each Topical Q0600   docusate sodium  100 mg Oral BID   folic acid  1 mg Oral Daily   gabapentin  300 mg Oral TID   heparin injection (subcutaneous)  5,000 Units Subcutaneous Q8H   lidocaine  2 patch Transdermal Q24H   methocarbamol  500 mg Oral Q8H   multivitamin with minerals  1 tablet Oral Daily   polyethylene glycol  17 g Oral Daily   thiamine  100 mg  Oral Daily   Or   thiamine  100 mg Intravenous Daily    Waverly Ferrari Office: [phone] 07/11/2023

## 2023-07-12 ENCOUNTER — Inpatient Hospital Stay (HOSPITAL_COMMUNITY): Payer: PPO

## 2023-07-12 DIAGNOSIS — F10139 Alcohol abuse with withdrawal, unspecified: Secondary | ICD-10-CM

## 2023-07-12 LAB — BASIC METABOLIC PANEL
Anion gap: 7 (ref 5–15)
BUN: 25 mg/dL — ABNORMAL HIGH (ref 8–23)
CO2: 23 mmol/L (ref 22–32)
Calcium: 8.1 mg/dL — ABNORMAL LOW (ref 8.9–10.3)
Chloride: 107 mmol/L (ref 98–111)
Creatinine, Ser: 1.31 mg/dL — ABNORMAL HIGH (ref 0.61–1.24)
GFR, Estimated: >60 mL/min (ref >60)
Glucose, Bld: 146 mg/dL — ABNORMAL HIGH (ref 70–99)
Potassium: 4.2 mmol/L (ref 3.5–5.1)
Sodium: 137 mmol/L (ref 135–145)

## 2023-07-12 LAB — CULTURE, RESPIRATORY W GRAM STAIN: Gram Stain: NONE SEEN

## 2023-07-12 LAB — AMMONIA: Ammonia: 56 umol/L — ABNORMAL HIGH (ref 9–35)

## 2023-07-12 LAB — HEPATIC FUNCTION PANEL
ALT: 130 U/L — ABNORMAL HIGH (ref 0–44)
AST: 105 U/L — ABNORMAL HIGH (ref 15–41)
Albumin: 2.7 g/dL — ABNORMAL LOW (ref 3.5–5.0)
Alkaline Phosphatase: 60 U/L (ref 38–126)
Bilirubin, Direct: 0.8 mg/dL — ABNORMAL HIGH (ref 0.0–0.2)
Indirect Bilirubin: 1.1 mg/dL — ABNORMAL HIGH (ref 0.3–0.9)
Total Bilirubin: 1.9 mg/dL — ABNORMAL HIGH (ref 0.3–1.2)
Total Protein: 5.9 g/dL — ABNORMAL LOW (ref 6.5–8.1)

## 2023-07-12 MED ORDER — FUROSEMIDE 10 MG/ML IJ SOLN
20.0000 mg | Freq: Once | INTRAMUSCULAR | Status: AC
  Administered 2023-07-12: 20 mg via INTRAVENOUS
  Filled 2023-07-12: qty 2

## 2023-07-12 MED ORDER — SENNA 8.6 MG PO TABS
2.0000 | ORAL_TABLET | Freq: Once | ORAL | Status: AC
  Administered 2023-07-12: 17.2 mg via ORAL
  Filled 2023-07-12: qty 2

## 2023-07-12 MED ORDER — BISACODYL 10 MG RE SUPP
10.0000 mg | Freq: Once | RECTAL | Status: AC
  Administered 2023-07-12: 10 mg via RECTAL
  Filled 2023-07-12: qty 1

## 2023-07-12 MED ORDER — ORAL CARE MOUTH RINSE
15.0000 mL | OROMUCOSAL | Status: DC | PRN
  Administered 2023-07-13: 15 mL via OROMUCOSAL

## 2023-07-12 MED ORDER — MAGNESIUM HYDROXIDE 400 MG/5ML PO SUSP
30.0000 mL | Freq: Once | ORAL | Status: AC
  Administered 2023-07-12: 30 mL via ORAL
  Filled 2023-07-12: qty 30

## 2023-07-12 MED ORDER — ENOXAPARIN SODIUM 40 MG/0.4ML IJ SOSY
40.0000 mg | PREFILLED_SYRINGE | Freq: Two times a day (BID) | INTRAMUSCULAR | Status: DC
  Administered 2023-07-12: 40 mg via SUBCUTANEOUS
  Filled 2023-07-12: qty 0.4

## 2023-07-12 MED ORDER — PHENOBARBITAL 32.4 MG PO TABS: 32.4000 mg | ORAL_TABLET | Freq: Three times a day (TID) | ORAL | Status: DC

## 2023-07-12 MED ORDER — PHENOBARBITAL 32.4 MG PO TABS
97.2000 mg | ORAL_TABLET | Freq: Three times a day (TID) | ORAL | Status: AC
  Administered 2023-07-12 – 2023-07-13 (×2): 97.2 mg
  Filled 2023-07-12 (×2): qty 3

## 2023-07-12 MED ORDER — LORAZEPAM 2 MG/ML IJ SOLN
0.5000 mg | INTRAMUSCULAR | Status: DC | PRN
  Administered 2023-07-12 – 2023-07-13 (×3): 0.5 mg via INTRAVENOUS
  Filled 2023-07-12 (×4): qty 1

## 2023-07-12 MED ORDER — ACETAMINOPHEN 500 MG PO TABS
1000.0000 mg | ORAL_TABLET | Freq: Four times a day (QID) | ORAL | Status: DC
  Administered 2023-07-12 – 2023-07-14 (×9): 1000 mg
  Filled 2023-07-12 (×9): qty 2

## 2023-07-12 MED ORDER — ORAL CARE MOUTH RINSE
15.0000 mL | OROMUCOSAL | Status: DC
  Administered 2023-07-12 – 2023-07-14 (×7): 15 mL via OROMUCOSAL

## 2023-07-12 MED ORDER — THIAMINE HCL 100 MG/ML IJ SOLN: 100.0000 mg | Freq: Every day | INTRAMUSCULAR | Status: DC

## 2023-07-12 MED ORDER — OXYCODONE HCL 5 MG PO TABS
5.0000 mg | ORAL_TABLET | ORAL | Status: DC | PRN
  Administered 2023-07-13 (×2): 5 mg
  Filled 2023-07-12 (×2): qty 1

## 2023-07-12 MED ORDER — DOCUSATE SODIUM 50 MG/5ML PO LIQD
100.0000 mg | Freq: Two times a day (BID) | ORAL | Status: DC
  Administered 2023-07-12 – 2023-07-14 (×6): 100 mg
  Filled 2023-07-12 (×6): qty 10

## 2023-07-12 MED ORDER — DEXMEDETOMIDINE HCL IN NACL 400 MCG/100ML IV SOLN
0.0000 ug/kg/h | INTRAVENOUS | Status: DC
  Administered 2023-07-12: 0.5 ug/kg/h via INTRAVENOUS
  Administered 2023-07-12: 0.4 ug/kg/h via INTRAVENOUS
  Administered 2023-07-13: 0.5 ug/kg/h via INTRAVENOUS
  Administered 2023-07-13: 0.3 ug/kg/h via INTRAVENOUS
  Administered 2023-07-14: 1.2 ug/kg/h via INTRAVENOUS
  Administered 2023-07-14: 0.5 ug/kg/h via INTRAVENOUS
  Administered 2023-07-14 – 2023-07-16 (×9): 1.2 ug/kg/h via INTRAVENOUS
  Administered 2023-07-16: 1 ug/kg/h via INTRAVENOUS
  Administered 2023-07-16: 1.2 ug/kg/h via INTRAVENOUS
  Administered 2023-07-16 (×2): 1 ug/kg/h via INTRAVENOUS
  Administered 2023-07-16: 1.2 ug/kg/h via INTRAVENOUS
  Administered 2023-07-17 – 2023-07-18 (×9): 1 ug/kg/h via INTRAVENOUS
  Filled 2023-07-12 (×2): qty 100
  Filled 2023-07-12: qty 200
  Filled 2023-07-12 (×2): qty 100
  Filled 2023-07-12: qty 200
  Filled 2023-07-12 (×14): qty 100
  Filled 2023-07-12: qty 200
  Filled 2023-07-12 (×7): qty 100

## 2023-07-12 MED ORDER — FUROSEMIDE 10 MG/ML IJ SOLN
40.0000 mg | Freq: Once | INTRAMUSCULAR | Status: AC
  Administered 2023-07-12: 40 mg via INTRAVENOUS
  Filled 2023-07-12: qty 4

## 2023-07-12 MED ORDER — ENOXAPARIN SODIUM 40 MG/0.4ML IJ SOSY
40.0000 mg | PREFILLED_SYRINGE | Freq: Two times a day (BID) | INTRAMUSCULAR | Status: DC
  Administered 2023-07-12 – 2023-07-17 (×10): 40 mg via SUBCUTANEOUS
  Filled 2023-07-12 (×10): qty 0.4

## 2023-07-12 MED ORDER — THIAMINE MONONITRATE 100 MG PO TABS
100.0000 mg | ORAL_TABLET | Freq: Every day | ORAL | Status: DC
  Administered 2023-07-13 – 2023-07-14 (×2): 100 mg
  Filled 2023-07-12 (×2): qty 1

## 2023-07-12 MED ORDER — FOLIC ACID 1 MG PO TABS
1.0000 mg | ORAL_TABLET | Freq: Every day | ORAL | Status: DC
  Administered 2023-07-13 – 2023-07-14 (×2): 1 mg
  Filled 2023-07-12 (×2): qty 1

## 2023-07-12 MED ORDER — MAGNESIUM CITRATE PO SOLN
1.0000 | Freq: Once | ORAL | Status: AC
  Administered 2023-07-14: 1
  Filled 2023-07-12: qty 296

## 2023-07-12 MED ORDER — SODIUM ZIRCONIUM CYCLOSILICATE 5 G PO PACK
5.0000 g | PACK | Freq: Once | ORAL | Status: AC
  Administered 2023-07-12: 5 g via ORAL
  Filled 2023-07-12: qty 1

## 2023-07-12 MED ORDER — MELATONIN 3 MG PO TABS
3.0000 mg | ORAL_TABLET | Freq: Every evening | ORAL | Status: DC | PRN
  Administered 2023-07-12: 3 mg
  Filled 2023-07-12: qty 1

## 2023-07-12 MED ORDER — PHENOBARBITAL 32.4 MG PO TABS
64.8000 mg | ORAL_TABLET | Freq: Three times a day (TID) | ORAL | Status: AC
  Administered 2023-07-13 – 2023-07-15 (×6): 64.8 mg
  Filled 2023-07-12 (×7): qty 2

## 2023-07-12 MED ORDER — BISACODYL 5 MG PO TBEC
10.0000 mg | DELAYED_RELEASE_TABLET | Freq: Once | ORAL | Status: DC
  Filled 2023-07-12: qty 2

## 2023-07-12 MED ORDER — OXYCODONE HCL 5 MG PO TABS
10.0000 mg | ORAL_TABLET | ORAL | Status: DC | PRN
  Administered 2023-07-13 – 2023-07-14 (×3): 10 mg
  Filled 2023-07-12 (×5): qty 2

## 2023-07-12 MED ORDER — LACTULOSE 10 GM/15ML PO SOLN
30.0000 g | Freq: Once | ORAL | Status: AC
  Administered 2023-07-12: 30 g
  Filled 2023-07-12: qty 45

## 2023-07-12 NOTE — Discharge Instructions (Signed)
In a time of Crisis: [facility]  Mobile Crisis Management provides immediate crisis response, 24/7.  Call [phone]  [facility] is available 24 hours a day, 7 days a week. Customer Service Specialists will assist you to find a crisis provider that is well-matched with your needs. Your local number is: [phone]  [facility] ([facility]) IOP, individual counseling, medication management [phone] [place], [place] [address] [phone] Call for intake hours; Medicaid and Uninsured    Outpatient Providers  [facility] Group and individual counseling. [address]  [place], [place] [address] [phone] [place]: [phone]  [place]: [phone] Medicaid and uninsured.   [facility] Offers IOP groups multiple times per week. [address] [place], [place] [address] [phone] Takes Medicaid and other insurances.   [facility]  Chemical Dependency Intensive Outpatient Program (IOP) [address] [place], [place] [address] [phone] Takes Nurse, learning disability and [place].   [facility]  IOP and Partial Hospitalization Program  [address]  [place], [place] [address] [phone] Private Insurance, [place] only for partial hospitalization  [facility] [address], [place], [place] [address] [phone] Monday-Friday. Short and Long term options. [facility] ([facility]) IOP, individual counseling, medication management [address] [place], [place] [address] [phone] Medicaid and [facility]  [facility] [address]  [place], [place] [address] [phone] Private Insurance and Self Pay   [facility] [address]  [place], [place] [address] [phone] Private Insurance, [place], and Self Pay   [facility]  [address] [place], [place] [address] [facility]  [address]  [place], [place] [address] [phone]  [facility]  [address] [place], [place] [address] [phone]      Residential Treatment Programs  [facility] [address] [place], [place] [address] [phone] or [phone] Detox and Residential Rehab 14 days (Medicare, Medicaid, private insurance, and self pay). No methadone. Call for pre-screen.   [facility]) [address]  [place], [place] [address] [phone] Detox (self Pay and Medicaid Limited availability) Rehab Only Male (Medicare, Medicaid, and Self Pay)-No methadone.  [facility] [address] [place], [place] [address] [phone] or [phone] Private Insurance only  [facility] [address] [place], [place] [address] Phone:  [phone] Must be detoxed 72 hours prior to admission; 28 day program.  Self-pay.  [facility] [address]  [place], [place] [phone] [employer], Medicare, [place] (not straight [place]). They offer assistance with transportation.   [facility] [address] [place],  [place], [place] [address] [phone] Christian Based Program. Men only. No insurance  [facility] [address] [place], [place] [address] Women's: [phone] Men's: [phone] No Medicaid.   [facility] [place] Locations across the [place] (mainly [place]) willing to help with transportation.  [phone] [employer]. [facility]  [address]  [place], [place] [address] [phone] Treatment Only, must make assessment appointment, and must be sober for assessment appointment. Self  pay, Medicare A and B, [facility], must be [facility] resident. No methadone.   [facility]  [address] [place], [place] [address] [phone] No pending legal charges, Long-term work program. No methadone. Call for assessment.  [facility]  [address], [place], [place] [address] [phone] or [phone] Commercial Insurance Only  [facility] - [phone] Private Insurance (no [place]). Males/Females, call to make referrals, multiple facilities   [facility] [address],  [place], [place] [address]  [phone] Men Only Upfront Fee   [facility]no methadone [facility] [address] [place], [place] [address] [phone] (f)                   AA Meetings Website to locate meetings (virtually or in person): [email or web address] Phone: [phone]
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
## 2023-07-12 NOTE — Progress Notes (Addendum)
  Progress Note  VASCULAR SURGERY ASSESSMENT & PLAN:    INTRAMURAL HEMATOMA THORACIC AORTA: This patient has a small defect in the mid descending thoracic aorta.  My plan is to repair this with a thoracic stent graft on Friday.  He is now having alcohol withdrawal.  Assuming he is stable by Friday we will proceed with surgery on Friday.  His renal function has improved.  Cari Caraway, MD 3:59 PM   07/12/2023 7:08 AM Hospital Day 2  Subjective:  sleepy; does not awake; family member states he received medicine last night  afebrile  Vitals:   07/12/23 0400 07/12/23 0500  BP: 102/69 110/76  Pulse: 73 68  Resp: 17 (!) 8  Temp: 97.8 F (36.6 C)   SpO2: 96% 93%    Physical Exam: General:  no distress Lungs:  non labored Extremities:  2+ bilateral radial and right DP and 1+ left DP pulses. Abdomen:  soft  CBC    Component Value Date/Time   WBC 16.6 (H) 07/12/2023 0411   RBC 3.39 (L) 07/12/2023 0411   HGB 11.3 (L) 07/12/2023 0411   HCT 33.7 (L) 07/12/2023 0411   PLT 176 07/12/2023 0411   MCV 99.4 07/12/2023 0411   MCH 33.3 07/12/2023 0411   MCHC 33.5 07/12/2023 0411   RDW 14.3 07/12/2023 0411   LYMPHSABS 1.8 01/18/2008 1635   MONOABS 0.5 01/18/2008 1635   EOSABS 0.3 01/18/2008 1635   BASOSABS 0.0 01/18/2008 1635    BMET    Component Value Date/Time   NA 135 07/12/2023 0411   K 5.1 07/12/2023 0411   CL 106 07/12/2023 0411   CO2 20 (L) 07/12/2023 0411   GLUCOSE 134 (H) 07/12/2023 0411   BUN 26 (H) 07/12/2023 0411   CREATININE 1.47 (H) 07/12/2023 0411   CALCIUM 7.8 (L) 07/12/2023 0411   GFRNONAA 52 (L) 07/12/2023 0411   GFRAA  01/18/2008 1635    >60        The eGFR has been calculated using the MDRD equation. This calculation has not been validated in all clinical    INR    Component Value Date/Time   INR 1.3 (H) 07/10/2023 0156     Intake/Output Summary (Last 24 hours) at 07/12/2023 0708 Last data filed at 07/12/2023 0600 Gross per 24 hour  Intake  1453.8 ml  Output 1580 ml  Net -126.2 ml     Assessment/Plan:  67 y.o. male with intramural hematoma of the thoracic aorta  Hospital Day 2  -pt with palpable distal pulses  -plan is for thoracic stent graft on Friday.   -renal function is improving - his creatinine is 1.47 today down from 2.40 yesterday morning.   -hgb drifted downward and is 11.3 from 13.1 yesterday.  Hemodynamically stable    Doreatha Massed, PA-C [facility] [phone] 07/12/2023 7:08 AM

## 2023-07-12 NOTE — Progress Notes (Signed)
Sputum culture collected and sent to lab by RT. Pt tolerated well, RT will monitor as needed.    07/12/23 1545  Therapy Vitals  Pulse Rate (!) 101  Resp 12  MEWS Score/Color  MEWS Score 3  MEWS Score Color Yellow  Respiratory Assessment  Respiratory Pattern Regular;Unlabored  Chest Assessment Chest expansion symmetrical  Cough Productive  Sputum Amount Small  Sputum Color Tan (brown)  Sputum Consistency Thick  Sputum Specimen Source (S)  Nasal tracheal (NTS order given for culture)  Bilateral Breath Sounds Expiratory wheezes  Oxygen Therapy/Pulse Ox  O2 Device Nasal Cannula  O2 Therapy Oxygen humidified  O2 Flow Rate (L/min) 6 L/min  SpO2 97 %
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
## 2023-07-13 ENCOUNTER — Inpatient Hospital Stay (HOSPITAL_COMMUNITY): Payer: PPO

## 2023-07-13 LAB — PHOSPHORUS: Phosphorus: 1.9 mg/dL — ABNORMAL LOW (ref 2.5–4.6)

## 2023-07-13 LAB — MAGNESIUM: Magnesium: 2.1 mg/dL (ref 1.7–2.4)

## 2023-07-13 LAB — GLUCOSE, CAPILLARY
Glucose-Capillary: 115 mg/dL — ABNORMAL HIGH (ref 70–99)
Glucose-Capillary: 121 mg/dL — ABNORMAL HIGH (ref 70–99)
Glucose-Capillary: 147 mg/dL — ABNORMAL HIGH (ref 70–99)

## 2023-07-13 MED ORDER — LACTULOSE 10 GM/15ML PO SOLN
30.0000 g | Freq: Two times a day (BID) | ORAL | Status: DC
  Administered 2023-07-13 (×2): 30 g via ORAL
  Filled 2023-07-13 (×2): qty 45

## 2023-07-13 MED ORDER — FUROSEMIDE 10 MG/ML IJ SOLN
20.0000 mg | Freq: Once | INTRAMUSCULAR | Status: AC
  Administered 2023-07-13: 20 mg via INTRAVENOUS
  Filled 2023-07-13: qty 2

## 2023-07-13 MED ORDER — FREE WATER
250.0000 mL | Freq: Four times a day (QID) | Status: DC
  Administered 2023-07-13 – 2023-07-14 (×7): 250 mL

## 2023-07-13 MED ORDER — PROSOURCE TF20 ENFIT COMPATIBL EN LIQD
60.0000 mL | Freq: Every day | ENTERAL | Status: DC
  Filled 2023-07-13: qty 60

## 2023-07-13 MED ORDER — SODIUM CHLORIDE 0.9 % IV SOLN
2.0000 g | Freq: Two times a day (BID) | INTRAVENOUS | Status: DC
  Administered 2023-07-13 – 2023-07-15 (×5): 2 g via INTRAVENOUS
  Filled 2023-07-13 (×5): qty 12.5

## 2023-07-13 MED ORDER — PIVOT 1.5 CAL PO LIQD
1000.0000 mL | ORAL | Status: DC
  Administered 2023-07-13: 1000 mL
  Administered 2023-07-14: 50 mL
  Administered 2023-07-14: 1000 mL

## 2023-07-13 NOTE — Progress Notes (Signed)
  VASCULAR SURGERY ASSESSMENT & PLAN:   INTRAMURAL HEMATOMA THORACIC AORTA: If the trauma team feels he is medically ready we will proceed with thoracic stent graft repair of his aortic injury on Friday.  His renal function has improved.  I did discuss the procedure and potential complications with his daughter at the bedside this morning.  She understands that without repair there is significant risk of progression and potential bleeding from this injury.  The risks of the procedure include bleeding, arterial injury, and a very small risk of paraplegia.  SUBJECTIVE:   Patient is resting.  He is in restraints because of his alcohol withdrawal.  PHYSICAL EXAM:   Vitals:   07/13/23 0400 07/13/23 0500 07/13/23 0600 07/13/23 0700  BP: 136/84 91/64 93/64  94/71  Pulse: 80 67 66 65  Resp: 16 10 10 10   Temp: 99.2 F (37.3 C)     TempSrc: Oral     SpO2: 95% 97% 97% 98%  Weight:      Height:       Palpable pedal pulses.  LABS:   Lab Results  Component Value Date   WBC 10.6 (H) 07/13/2023   HGB 11.4 (L) 07/13/2023   HCT 34.9 (L) 07/13/2023   MCV 103.6 (H) 07/13/2023   PLT 171 07/13/2023   Lab Results  Component Value Date   CREATININE 1.52 (H) 07/13/2023   Lab Results  Component Value Date   INR 1.3 (H) 07/10/2023   CBG (last 3)  No results for input(s): "GLUCAP" in the last 72 hours.  PROBLEM LIST:    Principal Problem:   Thoracic aorta injury   CURRENT MEDS:    acetaminophen  1,000 mg Per Tube Q6H   Chlorhexidine Gluconate Cloth  6 each Topical Q0600   docusate  100 mg Per Tube BID   enoxaparin (LOVENOX) injection  40 mg Subcutaneous Q12H   folic acid  1 mg Per Tube Daily   ipratropium-albuterol  3 mL Nebulization Q4H   lidocaine  2 patch Transdermal Q24H   magnesium citrate  1 Bottle Per Tube Once   multivitamin with minerals  1 tablet Oral Daily   mouth rinse  15 mL Mouth Rinse 4 times per day   phenobarbital  64.8 mg Per Tube Q8H   Followed by   [START ON  07/15/2023] phenobarbital  32.4 mg Per Tube Q8H   polyethylene glycol  17 g Oral Daily   thiamine  100 mg Per Tube Daily   Or   thiamine  100 mg Intravenous Daily    Waverly Ferrari Office: [phone] 07/13/2023
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
## 2023-07-13 NOTE — Progress Notes (Signed)
  Progress Note    07/13/2023 7:13 AM Hospital Day 3  Subjective:  sleeping-I did not wake him  Tm 99.7   Vitals:   07/13/23 0600 07/13/23 0700  BP: 93/64 94/71  Pulse: 66 65  Resp: 10 10  Temp:    SpO2: 97% 98%    Physical Exam: General:  no distress Lungs:  non labored Extremities:  palpable bilateral radial pulses and right DP and left PT pulses are palpable  CBC    Component Value Date/Time   WBC 10.6 (H) 07/13/2023 0356   RBC 3.37 (L) 07/13/2023 0356   HGB 11.4 (L) 07/13/2023 0356   HCT 34.9 (L) 07/13/2023 0356   PLT 171 07/13/2023 0356   MCV 103.6 (H) 07/13/2023 0356   MCH 33.8 07/13/2023 0356   MCHC 32.7 07/13/2023 0356   RDW 14.4 07/13/2023 0356   LYMPHSABS 1.8 01/18/2008 1635   MONOABS 0.5 01/18/2008 1635   EOSABS 0.3 01/18/2008 1635   BASOSABS 0.0 01/18/2008 1635    BMET    Component Value Date/Time   NA 140 07/13/2023 0356   K 4.2 07/13/2023 0356   CL 106 07/13/2023 0356   CO2 21 (L) 07/13/2023 0356   GLUCOSE 119 (H) 07/13/2023 0356   BUN 29 (H) 07/13/2023 0356   CREATININE 1.52 (H) 07/13/2023 0356   CALCIUM 8.2 (L) 07/13/2023 0356   GFRNONAA 50 (L) 07/13/2023 0356   GFRAA  01/18/2008 1635    >60        The eGFR has been calculated using the MDRD equation. This calculation has not been validated in all clinical    INR    Component Value Date/Time   INR 1.3 (H) 07/10/2023 0156     Intake/Output Summary (Last 24 hours) at 07/13/2023 0713 Last data filed at 07/13/2023 0700 Gross per 24 hour  Intake 1999.71 ml  Output 2840 ml  Net -840.29 ml     Assessment/Plan:  67 y.o. male  with intramural hematoma of the thoracic aorta   Hospital Day 3  -pt continues to have palpable distal pulses -renal function had improved to 1.31 yesterday and increased to 1.52 today.   -hgb stable from yesterday -pt on CIWA protocol -plan for OR Friday for thoracic stent graft if pt stable   Doreatha Massed, PA-C [facility] [phone] 07/13/2023 7:13 AM
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
## 2023-07-13 NOTE — Procedures (Signed)
Cortrak  Person Inserting Tube:  Thomas, Aspen T, RD Tube Type:  Cortrak - 43 inches Tube Size:  10 Tube Location:  Left nare Secured by: Bridle Technique Used to Measure Tube Placement:  Marking at nare/corner of mouth Cortrak Secured At:  74 cm   Cortrak Tube Team Note:  Consult received to place a Cortrak feeding tube.   X-ray is required, abdominal x-ray has been ordered by the Cortrak team. Please confirm tube placement before using the Cortrak tube.   If the tube becomes dislodged please keep the tube and contact the Cortrak team at [email or web address] for replacement.  If after hours and replacement cannot be delayed, place a NG tube and confirm placement with an abdominal x-ray.    Samson Frederic RD, LDN For contact information, refer to [facility].

## 2023-07-14 ENCOUNTER — Inpatient Hospital Stay (HOSPITAL_COMMUNITY): Payer: PPO

## 2023-07-14 LAB — POCT I-STAT 7, (LYTES, BLD GAS, ICA,H+H)
Acid-Base Excess: 0 mmol/L (ref 0.0–2.0)
Bicarbonate: 26.7 mmol/L (ref 20.0–28.0)
Calcium, Ion: 1.22 mmol/L (ref 1.15–1.40)
HCT: 33 % — ABNORMAL LOW (ref 39.0–52.0)
Hemoglobin: 11.2 g/dL — ABNORMAL LOW (ref 13.0–17.0)
O2 Saturation: 100 %
Patient temperature: 101
Potassium: 4.3 mmol/L (ref 3.5–5.1)
Sodium: 144 mmol/L (ref 135–145)
TCO2: 28 mmol/L (ref 22–32)
pCO2 arterial: 56.8 mmHg — ABNORMAL HIGH (ref 32–48)
pH, Arterial: 7.286 — ABNORMAL LOW (ref 7.35–7.45)
pO2, Arterial: 330 mmHg — ABNORMAL HIGH (ref 83–108)

## 2023-07-14 LAB — HEMOGLOBIN A1C
Hgb A1c MFr Bld: 5 % (ref 4.8–5.6)
Mean Plasma Glucose: 96.8 mg/dL

## 2023-07-14 LAB — GLUCOSE, CAPILLARY
Glucose-Capillary: 160 mg/dL — ABNORMAL HIGH (ref 70–99)
Glucose-Capillary: 184 mg/dL — ABNORMAL HIGH (ref 70–99)
Glucose-Capillary: 139 mg/dL — ABNORMAL HIGH (ref 70–99)
Glucose-Capillary: 158 mg/dL — ABNORMAL HIGH (ref 70–99)
Glucose-Capillary: 172 mg/dL — ABNORMAL HIGH (ref 70–99)
Glucose-Capillary: 145 mg/dL — ABNORMAL HIGH (ref 70–99)

## 2023-07-14 MED ORDER — ETOMIDATE 2 MG/ML IV SOLN
INTRAVENOUS | Status: AC
  Administered 2023-07-14: 20 mg
  Filled 2023-07-14: qty 10

## 2023-07-14 MED ORDER — CHLORHEXIDINE GLUCONATE CLOTH 2 % EX PADS
6.0000 | MEDICATED_PAD | Freq: Every day | CUTANEOUS | Status: DC
  Administered 2023-07-14: 6 via TOPICAL

## 2023-07-14 MED ORDER — ORAL CARE MOUTH RINSE: 15.0000 mL | OROMUCOSAL | Status: DC | PRN

## 2023-07-14 MED ORDER — NOREPINEPHRINE 4 MG/250ML-% IV SOLN
0.0000 ug/min | INTRAVENOUS | Status: DC
  Administered 2023-07-14: 2 ug/min via INTRAVENOUS
  Administered 2023-07-17: 26 ug/min via INTRAVENOUS
  Administered 2023-07-17: 22 ug/min via INTRAVENOUS
  Administered 2023-07-17: 7 ug/min via INTRAVENOUS
  Administered 2023-07-17: 26 ug/min via INTRAVENOUS
  Administered 2023-07-17: 16 ug/min via INTRAVENOUS
  Administered 2023-07-18 (×3): 26 ug/min via INTRAVENOUS
  Filled 2023-07-14 (×5): qty 250
  Filled 2023-07-14: qty 500
  Filled 2023-07-14 (×3): qty 250

## 2023-07-14 MED ORDER — ETOMIDATE 2 MG/ML IV SOLN
INTRAVENOUS | Status: AC
  Filled 2023-07-14: qty 10

## 2023-07-14 MED ORDER — CHLORHEXIDINE GLUCONATE CLOTH 2 % EX PADS
6.0000 | MEDICATED_PAD | Freq: Every day | CUTANEOUS | Status: DC
  Administered 2023-07-16 – 2023-07-18 (×3): 6 via TOPICAL

## 2023-07-14 MED ORDER — PROPOFOL 1000 MG/100ML IV EMUL
5.0000 ug/kg/min | INTRAVENOUS | Status: DC
  Administered 2023-07-14: 10 ug/kg/min via INTRAVENOUS
  Administered 2023-07-15 – 2023-07-18 (×22): 40 ug/kg/min via INTRAVENOUS
  Filled 2023-07-14 (×10): qty 100
  Filled 2023-07-14: qty 200
  Filled 2023-07-14 (×10): qty 100

## 2023-07-14 MED ORDER — MIDAZOLAM HCL 2 MG/2ML IJ SOLN
INTRAMUSCULAR | Status: AC
  Filled 2023-07-14: qty 2

## 2023-07-14 MED ORDER — INSULIN ASPART 100 UNIT/ML IJ SOLN
0.0000 [IU] | INTRAMUSCULAR | Status: DC
  Administered 2023-07-14: 4 [IU] via SUBCUTANEOUS
  Administered 2023-07-14: 3 [IU] via SUBCUTANEOUS
  Administered 2023-07-14 – 2023-07-15 (×3): 4 [IU] via SUBCUTANEOUS
  Administered 2023-07-15: 3 [IU] via SUBCUTANEOUS
  Administered 2023-07-15: 7 [IU] via SUBCUTANEOUS
  Administered 2023-07-15: 3 [IU] via SUBCUTANEOUS
  Administered 2023-07-16 (×3): 4 [IU] via SUBCUTANEOUS
  Administered 2023-07-16: 7 [IU] via SUBCUTANEOUS
  Administered 2023-07-16 – 2023-07-17 (×2): 4 [IU] via SUBCUTANEOUS
  Administered 2023-07-17: 3 [IU] via SUBCUTANEOUS
  Administered 2023-07-17 (×3): 4 [IU] via SUBCUTANEOUS
  Administered 2023-07-17 (×2): 3 [IU] via SUBCUTANEOUS
  Administered 2023-07-18 (×2): 7 [IU] via SUBCUTANEOUS

## 2023-07-14 MED ORDER — ROCURONIUM BROMIDE 10 MG/ML (PF) SYRINGE
PREFILLED_SYRINGE | INTRAVENOUS | Status: AC
  Administered 2023-07-14: 100 mg
  Filled 2023-07-14: qty 10

## 2023-07-14 MED ORDER — FENTANYL 2500MCG IN NS 250ML (10MCG/ML) PREMIX INFUSION
50.0000 ug/h | INTRAVENOUS | Status: DC
  Administered 2023-07-14: 50 ug/h via INTRAVENOUS
  Administered 2023-07-15 – 2023-07-18 (×7): 200 ug/h via INTRAVENOUS
  Filled 2023-07-14 (×9): qty 250

## 2023-07-14 MED ORDER — PANTOPRAZOLE SODIUM 40 MG IV SOLR
40.0000 mg | Freq: Every day | INTRAVENOUS | Status: DC
  Administered 2023-07-14 – 2023-07-17 (×4): 40 mg via INTRAVENOUS
  Filled 2023-07-14 (×4): qty 10

## 2023-07-14 MED ORDER — FENTANYL CITRATE PF 50 MCG/ML IJ SOSY
50.0000 ug | PREFILLED_SYRINGE | Freq: Once | INTRAMUSCULAR | Status: AC
  Administered 2023-07-14: 50 ug via INTRAVENOUS

## 2023-07-14 MED ORDER — SODIUM PHOSPHATES 45 MMOLE/15ML IV SOLN
15.0000 mmol | Freq: Once | INTRAVENOUS | Status: AC
  Administered 2023-07-14: 15 mmol via INTRAVENOUS
  Filled 2023-07-14: qty 5

## 2023-07-14 MED ORDER — PIVOT 1.5 CAL PO LIQD
1000.0000 mL | ORAL | Status: DC
  Administered 2023-07-14 (×2): 1000 mL

## 2023-07-14 MED ORDER — SODIUM PHOSPHATES 45 MMOLE/15ML IV SOLN
30.0000 mmol | Freq: Once | INTRAVENOUS | Status: AC
  Administered 2023-07-14: 30 mmol via INTRAVENOUS
  Filled 2023-07-14: qty 10

## 2023-07-14 MED ORDER — LACTULOSE 10 GM/15ML PO SOLN
30.0000 g | Freq: Three times a day (TID) | ORAL | Status: DC
  Administered 2023-07-14 (×3): 30 g via ORAL
  Filled 2023-07-14 (×3): qty 45

## 2023-07-14 MED ORDER — ORAL CARE MOUTH RINSE
15.0000 mL | OROMUCOSAL | Status: DC
  Administered 2023-07-14 – 2023-07-18 (×47): 15 mL via OROMUCOSAL

## 2023-07-14 MED ORDER — IPRATROPIUM-ALBUTEROL 0.5-2.5 (3) MG/3ML IN SOLN
3.0000 mL | RESPIRATORY_TRACT | Status: DC
  Administered 2023-07-14 – 2023-07-18 (×23): 3 mL via RESPIRATORY_TRACT
  Filled 2023-07-14 (×22): qty 3

## 2023-07-14 MED ORDER — FENTANYL BOLUS VIA INFUSION
50.0000 ug | INTRAVENOUS | Status: DC | PRN
  Administered 2023-07-14 – 2023-07-18 (×3): 50 ug via INTRAVENOUS

## 2023-07-14 MED ORDER — FENTANYL CITRATE PF 50 MCG/ML IJ SOSY
PREFILLED_SYRINGE | INTRAMUSCULAR | Status: AC
  Filled 2023-07-14: qty 2

## 2023-07-14 MED ORDER — CEFAZOLIN SODIUM-DEXTROSE 2-4 GM/100ML-% IV SOLN: 2.0000 g | INTRAVENOUS | Status: DC

## 2023-07-14 MED ORDER — SODIUM CHLORIDE 0.9 % IV BOLUS
500.0000 mL | Freq: Once | INTRAVENOUS | Status: AC
  Administered 2023-07-14: 500 mL via INTRAVENOUS

## 2023-07-14 MED ORDER — FUROSEMIDE 10 MG/ML IJ SOLN: 40.0000 mg | Freq: Once | INTRAMUSCULAR | Status: DC

## 2023-07-14 MED ORDER — NOREPINEPHRINE 4 MG/250ML-% IV SOLN: 0.0000 ug/min | INTRAVENOUS | Status: DC

## 2023-07-14 NOTE — Progress Notes (Addendum)
2100- patient began double-stacking the ventilator, increased peak pressures, precedex and fentanyl maxed out over the previous hour. Urine output also low, 10 mL the last hour. Abdomen is tight, magnesium sulfate given and waiting for pt to have a bowel movement. Evening phosphorus also low 1.1.Trauma MD paged.   2120- new orders to add propofol gtt to sedation, replace electrolytes with sodium phosphate IV, and watch urine output.  2315- Trauma MD paged about urine output still around 10-12 mL an hour. Still no bowel movement.  2350- New verbal order for a 500 mL bolus of NS for urine output.  0200- pt vomited dark brown bile after per tube phenobarb was given and patient was turned. Trauma MD made aware. Verbal order to place OG and hook it to low intermittent suction. Tube feed was already turned off at midnight for upcoming procedure today.
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
## 2023-07-14 NOTE — Progress Notes (Signed)
OT Cancellation Note  Patient Details Name: Jerry Daniels MRN: [id number] DOB: Nov 14, 1956   Cancelled Treatment:    Reason Eval/Treat Not Completed: Patient not medically ready- per RN, intubated this am.  Will follow as see as able and appropriate.   Barry Brunner, OT Acute Rehabilitation Services Office [phone]   Chancy Milroy 07/14/2023, 10:11 AM
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
## 2023-07-14 NOTE — Progress Notes (Signed)
PT Cancellation Note  Patient Details Name: Jerry Daniels MRN: [id number] DOB: 1956/10/27   Cancelled Treatment:    Reason Eval/Treat Not Completed: Patient not medically ready (about to be intubated). Will follow for medical readiness.   Lyanne Co, PT  Acute Rehab Services Secure chat preferred Office [phone]    Jerry Daniels 07/14/2023, 8:26 AM

## 2023-07-14 NOTE — Progress Notes (Addendum)
  Progress Note  VASCULAR SURGERY ASSESSMENT & PLAN:   INTRAMURAL HEMATOMA THORACIC AORTA: This patient has a defect in the descending thoracic aorta with significant intramural hematoma.  I have recommended repair using a thoracic stent graft.  We have discussed the indications for surgery and the potential complications with the patient's daughter in detail yesterday.  I discussed this again with the son and daughter this morning.  Will proceed tomorrow if the trauma service agrees that he is medically stable.  I have written preop orders.  Cari Caraway, MD 8:21 AM   07/14/2023 7:35 AM Hospital Day 4  Subjective:  daughter at bedside-says her father isn't really waking up   Vitals:   07/14/23 0600 07/14/23 0700  BP: (!) 169/94 (!) 141/86  Pulse: (!) 112 95  Resp: 17 13  Temp:    SpO2: 96% 96%    Physical Exam: General:  no distress;  doesn't wake to voice Lungs:  non labored Extremities:  palpable DP and PT pulses bilaterally  CBC    Component Value Date/Time   WBC 10.1 07/14/2023 0558   RBC 3.65 (L) 07/14/2023 0558   HGB 12.4 (L) 07/14/2023 0558   HCT 37.5 (L) 07/14/2023 0558   PLT 260 07/14/2023 0558   MCV 102.7 (H) 07/14/2023 0558   MCH 34.0 07/14/2023 0558   MCHC 33.1 07/14/2023 0558   RDW 14.7 07/14/2023 0558   LYMPHSABS 1.8 01/18/2008 1635   MONOABS 0.5 01/18/2008 1635   EOSABS 0.3 01/18/2008 1635   BASOSABS 0.0 01/18/2008 1635    BMET    Component Value Date/Time   NA 141 07/14/2023 0558   K 4.4 07/14/2023 0558   CL 106 07/14/2023 0558   CO2 26 07/14/2023 0558   GLUCOSE 180 (H) 07/14/2023 0558   BUN 31 (H) 07/14/2023 0558   CREATININE 1.39 (H) 07/14/2023 0558   CALCIUM 8.4 (L) 07/14/2023 0558   GFRNONAA 56 (L) 07/14/2023 0558   GFRAA  01/18/2008 1635    >60        The eGFR has been calculated using the MDRD equation. This calculation has not been validated in all clinical    INR    Component Value Date/Time   INR 1.3 (H) 07/10/2023  0156     Intake/Output Summary (Last 24 hours) at 07/14/2023 0735 Last data filed at 07/14/2023 0700 Gross per 24 hour  Intake 2395.2 ml  Output 1870 ml  Net 525.2 ml     Assessment/Plan:  67 y.o. male with intramural hematoma of the thoracic aorta   Hospital Day 4  -Dr. Edilia Bo to see pt today and determine plan about proceeding with thoracic stent graft tomorrow.  Creatinine improved to 1.39 today.  Continues to have some altered mental status and remains in restraints due to etoh withdrawal -hgb improved (he has not received any blood products)   Doreatha Massed, PA-C [facility] [phone] 07/14/2023 7:35 AM

## 2023-07-14 NOTE — Procedures (Signed)
Intubation Procedure Note  Jerry Daniels  [id number]  Aug 02, 1956  Date:07/14/23  Time:10:35 AM   Provider Performing:Anyesha N Smith Mcnicholas    Procedure: Intubation (31500)  Indication(s) Respiratory Failure  Consent Risks of the procedure as well as the alternatives and risks of each were explained to the patient and/or caregiver.  Consent for the procedure was obtained and is signed in the bedside chart   Anesthesia Etomidate and Rocuronium   Time Out Verified patient identification, verified procedure, site/side was marked, verified correct patient position, special equipment/implants available, medications/allergies/relevant history reviewed, required imaging and test results available.   Sterile Technique Usual hand hygeine, masks, and gloves were used   Procedure Description Patient positioned in bed supine.  Sedation given as noted above.  Patient was intubated with endotracheal tube using Glidescope.  View was Grade 2 only posterior commissure .  Number of attempts was 1.  Colorimetric CO2 detector was consistent with tracheal placement.   Complications/Tolerance None; patient tolerated the procedure well. Chest X-ray is ordered to verify placement.   EBL none   Specimen(s) None

## 2023-07-14 DEATH — deceased

## 2023-07-15 ENCOUNTER — Other Ambulatory Visit: Payer: Self-pay

## 2023-07-15 ENCOUNTER — Inpatient Hospital Stay (HOSPITAL_COMMUNITY): Payer: PPO

## 2023-07-15 ENCOUNTER — Inpatient Hospital Stay (HOSPITAL_COMMUNITY): Payer: PPO | Admitting: Anesthesiology

## 2023-07-15 ENCOUNTER — Encounter (HOSPITAL_COMMUNITY): Admission: EM | Disposition: E | Payer: Self-pay | Source: Home / Self Care

## 2023-07-15 DIAGNOSIS — S2500XA Unspecified injury of thoracic aorta, initial encounter: Secondary | ICD-10-CM

## 2023-07-15 DIAGNOSIS — E162 Hypoglycemia, unspecified: Secondary | ICD-10-CM

## 2023-07-15 DIAGNOSIS — E782 Mixed hyperlipidemia: Secondary | ICD-10-CM

## 2023-07-15 DIAGNOSIS — R079 Chest pain, unspecified: Secondary | ICD-10-CM

## 2023-07-15 HISTORY — PX: LAPAROTOMY: SHX154

## 2023-07-15 HISTORY — PX: THORACIC AORTIC ENDOVASCULAR STENT GRAFT: SHX6112

## 2023-07-15 HISTORY — PX: APPLICATION OF WOUND VAC: SHX5189

## 2023-07-15 LAB — POCT I-STAT 7, (LYTES, BLD GAS, ICA,H+H)
Acid-base deficit: 2 mmol/L (ref 0.0–2.0)
Acid-base deficit: 3 mmol/L — ABNORMAL HIGH (ref 0.0–2.0)
Acid-base deficit: 2 mmol/L (ref 0.0–2.0)
Acid-base deficit: 1 mmol/L (ref 0.0–2.0)
Bicarbonate: 24.9 mmol/L (ref 20.0–28.0)
Bicarbonate: 25.4 mmol/L (ref 20.0–28.0)
Bicarbonate: 24.7 mmol/L (ref 20.0–28.0)
Bicarbonate: 24.7 mmol/L (ref 20.0–28.0)
Calcium, Ion: 1.17 mmol/L (ref 1.15–1.40)
Calcium, Ion: 1.17 mmol/L (ref 1.15–1.40)
Calcium, Ion: 1.14 mmol/L — ABNORMAL LOW (ref 1.15–1.40)
Calcium, Ion: 1.1 mmol/L — ABNORMAL LOW (ref 1.15–1.40)
HCT: 31 % — ABNORMAL LOW (ref 39.0–52.0)
HCT: 31 % — ABNORMAL LOW (ref 39.0–52.0)
HCT: 31 % — ABNORMAL LOW (ref 39.0–52.0)
HCT: 36 % — ABNORMAL LOW (ref 39.0–52.0)
Hemoglobin: 10.5 g/dL — ABNORMAL LOW (ref 13.0–17.0)
Hemoglobin: 10.5 g/dL — ABNORMAL LOW (ref 13.0–17.0)
Hemoglobin: 10.5 g/dL — ABNORMAL LOW (ref 13.0–17.0)
Hemoglobin: 12.2 g/dL — ABNORMAL LOW (ref 13.0–17.0)
O2 Saturation: 96 %
O2 Saturation: 90 %
O2 Saturation: 90 %
O2 Saturation: 94 %
Patient temperature: 37.2
Patient temperature: 37.1
Potassium: 3.2 mmol/L — ABNORMAL LOW (ref 3.5–5.1)
Potassium: 3.6 mmol/L (ref 3.5–5.1)
Potassium: 4.1 mmol/L (ref 3.5–5.1)
Potassium: 4.1 mmol/L (ref 3.5–5.1)
Sodium: 145 mmol/L (ref 135–145)
Sodium: 144 mmol/L (ref 135–145)
Sodium: 143 mmol/L (ref 135–145)
Sodium: 142 mmol/L (ref 135–145)
TCO2: 27 mmol/L (ref 22–32)
TCO2: 27 mmol/L (ref 22–32)
TCO2: 26 mmol/L (ref 22–32)
TCO2: 26 mmol/L (ref 22–32)
pCO2 arterial: 54.1 mmHg — ABNORMAL HIGH (ref 32–48)
pCO2 arterial: 61.1 mmHg — ABNORMAL HIGH (ref 32–48)
pCO2 arterial: 47.8 mmHg (ref 32–48)
pCO2 arterial: 44.3 mmHg (ref 32–48)
pH, Arterial: 7.271 — ABNORMAL LOW (ref 7.35–7.45)
pH, Arterial: 7.226 — ABNORMAL LOW (ref 7.35–7.45)
pH, Arterial: 7.323 — ABNORMAL LOW (ref 7.35–7.45)
pH, Arterial: 7.355 (ref 7.35–7.45)
pO2, Arterial: 93 mmHg (ref 83–108)
pO2, Arterial: 71 mmHg — ABNORMAL LOW (ref 83–108)
pO2, Arterial: 65 mmHg — ABNORMAL LOW (ref 83–108)
pO2, Arterial: 75 mmHg — ABNORMAL LOW (ref 83–108)

## 2023-07-15 LAB — URINALYSIS, ROUTINE W REFLEX MICROSCOPIC
Bilirubin Urine: NEGATIVE
Glucose, UA: NEGATIVE mg/dL
Hgb urine dipstick: NEGATIVE
Ketones, ur: NEGATIVE mg/dL
Nitrite: NEGATIVE
Protein, ur: 100 mg/dL — AB
Specific Gravity, Urine: 1.03 (ref 1.005–1.030)
pH: 5 (ref 5.0–8.0)

## 2023-07-15 LAB — COMPREHENSIVE METABOLIC PANEL
ALT: 80 U/L — ABNORMAL HIGH (ref 0–44)
AST: 94 U/L — ABNORMAL HIGH (ref 15–41)
Albumin: 2.5 g/dL — ABNORMAL LOW (ref 3.5–5.0)
Alkaline Phosphatase: 98 U/L (ref 38–126)
Anion gap: 13 (ref 5–15)
BUN: 40 mg/dL — ABNORMAL HIGH (ref 8–23)
CO2: 24 mmol/L (ref 22–32)
Calcium: 8.4 mg/dL — ABNORMAL LOW (ref 8.9–10.3)
Chloride: 106 mmol/L (ref 98–111)
Creatinine, Ser: 1.85 mg/dL — ABNORMAL HIGH (ref 0.61–1.24)
GFR, Estimated: 40 mL/min — ABNORMAL LOW (ref >60)
Glucose, Bld: 147 mg/dL — ABNORMAL HIGH (ref 70–99)
Potassium: 3.4 mmol/L — ABNORMAL LOW (ref 3.5–5.1)
Sodium: 143 mmol/L (ref 135–145)
Total Bilirubin: 1.3 mg/dL — ABNORMAL HIGH (ref 0.3–1.2)
Total Protein: 6.2 g/dL — ABNORMAL LOW (ref 6.5–8.1)

## 2023-07-15 LAB — BASIC METABOLIC PANEL
Anion gap: 10 (ref 5–15)
BUN: 40 mg/dL — ABNORMAL HIGH (ref 8–23)
CO2: 23 mmol/L (ref 22–32)
Calcium: 7.9 mg/dL — ABNORMAL LOW (ref 8.9–10.3)
Chloride: 109 mmol/L (ref 98–111)
Creatinine, Ser: 1.63 mg/dL — ABNORMAL HIGH (ref 0.61–1.24)
GFR, Estimated: 46 mL/min — ABNORMAL LOW (ref >60)
Glucose, Bld: 180 mg/dL — ABNORMAL HIGH (ref 70–99)
Potassium: 4.2 mmol/L (ref 3.5–5.1)
Sodium: 142 mmol/L (ref 135–145)

## 2023-07-15 LAB — TYPE AND SCREEN
ABO/RH(D): B POS
Antibody Screen: NEGATIVE

## 2023-07-15 LAB — GLUCOSE, CAPILLARY
Glucose-Capillary: 127 mg/dL — ABNORMAL HIGH (ref 70–99)
Glucose-Capillary: 121 mg/dL — ABNORMAL HIGH (ref 70–99)
Glucose-Capillary: 137 mg/dL — ABNORMAL HIGH (ref 70–99)
Glucose-Capillary: 191 mg/dL — ABNORMAL HIGH (ref 70–99)
Glucose-Capillary: 237 mg/dL — ABNORMAL HIGH (ref 70–99)

## 2023-07-15 LAB — APTT: aPTT: 36 seconds (ref 24–36)

## 2023-07-15 LAB — POCT ACTIVATED CLOTTING TIME: Activated Clotting Time: 250 s

## 2023-07-15 LAB — PROTIME-INR
INR: 1.2 (ref 0.8–1.2)
Prothrombin Time: 15.7 seconds — ABNORMAL HIGH (ref 11.4–15.2)

## 2023-07-15 SURGERY — INSERTION, ENDOVASCULAR STENT GRAFT, AORTA, THORACIC
Anesthesia: Choice | Site: Abdomen

## 2023-07-15 MED ORDER — PROPOFOL 10 MG/ML IV BOLUS
INTRAVENOUS | Status: AC
  Filled 2023-07-15: qty 20

## 2023-07-15 MED ORDER — SODIUM CHLORIDE 0.9% FLUSH: 10.0000 mL | INTRAVENOUS | Status: DC | PRN

## 2023-07-15 MED ORDER — IOHEXOL 9 MG/ML PO SOLN: 500.0000 mL | ORAL | Status: DC

## 2023-07-15 MED ORDER — POLYETHYLENE GLYCOL 3350 17 G PO PACK: 17.0000 g | PACK | Freq: Every day | ORAL | Status: DC

## 2023-07-15 MED ORDER — THIAMINE HCL 100 MG/ML IJ SOLN: 100.0000 mg | Freq: Every day | INTRAMUSCULAR | Status: DC

## 2023-07-15 MED ORDER — HEPARIN SODIUM (PORCINE) 1000 UNIT/ML IJ SOLN
INTRAMUSCULAR | Status: AC
  Filled 2023-07-15: qty 20

## 2023-07-15 MED ORDER — HEPARIN 6000 UNIT IRRIGATION SOLUTION
Status: AC
  Filled 2023-07-15: qty 500

## 2023-07-15 MED ORDER — 0.9 % SODIUM CHLORIDE (POUR BTL) OPTIME
TOPICAL | Status: DC | PRN
  Administered 2023-07-15: 3000 mL

## 2023-07-15 MED ORDER — SODIUM CHLORIDE 0.9 % IV SOLN: INTRAVENOUS | Status: DC

## 2023-07-15 MED ORDER — ROCURONIUM BROMIDE 10 MG/ML (PF) SYRINGE
PREFILLED_SYRINGE | INTRAVENOUS | Status: DC | PRN
  Administered 2023-07-15: 50 mg via INTRAVENOUS
  Administered 2023-07-15: 40 mg via INTRAVENOUS
  Administered 2023-07-15: 20 mg via INTRAVENOUS
  Administered 2023-07-15: 50 mg via INTRAVENOUS

## 2023-07-15 MED ORDER — LACTULOSE 10 GM/15ML PO SOLN: 30.0000 g | Freq: Three times a day (TID) | ORAL | Status: AC

## 2023-07-15 MED ORDER — MELATONIN 3 MG PO TABS: 3.0000 mg | ORAL_TABLET | Freq: Every evening | ORAL | Status: DC | PRN

## 2023-07-15 MED ORDER — ADULT MULTIVITAMIN W/MINERALS CH: 1.0000 | ORAL_TABLET | Freq: Every day | ORAL | Status: DC

## 2023-07-15 MED ORDER — THIAMINE MONONITRATE 100 MG PO TABS: 100.0000 mg | ORAL_TABLET | Freq: Every day | ORAL | Status: DC

## 2023-07-15 MED ORDER — OXYCODONE HCL 5 MG PO TABS: 10.0000 mg | ORAL_TABLET | ORAL | Status: DC | PRN

## 2023-07-15 MED ORDER — DEXAMETHASONE SODIUM PHOSPHATE 10 MG/ML IJ SOLN
INTRAMUSCULAR | Status: DC | PRN
  Administered 2023-07-15: 10 mg via INTRAVENOUS

## 2023-07-15 MED ORDER — ONDANSETRON HCL 4 MG/2ML IJ SOLN
INTRAMUSCULAR | Status: DC | PRN
Start: 2023-07-15 — End: 2023-07-15
  Administered 2023-07-15: 4 mg via INTRAVENOUS

## 2023-07-15 MED ORDER — PHENOBARBITAL 32.4 MG PO TABS: 32.4000 mg | ORAL_TABLET | Freq: Three times a day (TID) | ORAL | Status: DC

## 2023-07-15 MED ORDER — FENTANYL CITRATE (PF) 250 MCG/5ML IJ SOLN
INTRAMUSCULAR | Status: AC
  Filled 2023-07-15: qty 5

## 2023-07-15 MED ORDER — SODIUM PHOSPHATES 45 MMOLE/15ML IV SOLN
15.0000 mmol | Freq: Once | INTRAVENOUS | Status: AC
  Administered 2023-07-15: 15 mmol via INTRAVENOUS
  Filled 2023-07-15: qty 5

## 2023-07-15 MED ORDER — ONDANSETRON HCL 4 MG/2ML IJ SOLN
INTRAMUSCULAR | Status: AC
  Filled 2023-07-15: qty 2

## 2023-07-15 MED ORDER — FUROSEMIDE 10 MG/ML IJ SOLN
20.0000 mg | Freq: Once | INTRAMUSCULAR | Status: AC
  Administered 2023-07-15: 20 mg via INTRAVENOUS
  Filled 2023-07-15: qty 2

## 2023-07-15 MED ORDER — IOHEXOL 9 MG/ML PO SOLN
500.0000 mL | ORAL | Status: AC
  Administered 2023-07-15: 500 mL via ORAL

## 2023-07-15 MED ORDER — OXYCODONE HCL 5 MG PO TABS: 5.0000 mg | ORAL_TABLET | ORAL | Status: DC | PRN

## 2023-07-15 MED ORDER — HEPARIN SODIUM (PORCINE) 1000 UNIT/ML IJ SOLN
INTRAMUSCULAR | Status: DC | PRN
  Administered 2023-07-15: 11000 [IU] via INTRAVENOUS

## 2023-07-15 MED ORDER — SODIUM CHLORIDE 0.9% FLUSH
10.0000 mL | Freq: Two times a day (BID) | INTRAVENOUS | Status: DC
  Administered 2023-07-16 – 2023-07-18 (×3): 10 mL

## 2023-07-15 MED ORDER — CHLORHEXIDINE GLUCONATE CLOTH 2 % EX PADS
6.0000 | MEDICATED_PAD | Freq: Once | CUTANEOUS | Status: AC
  Administered 2023-07-15: 6 via TOPICAL

## 2023-07-15 MED ORDER — ACETAMINOPHEN 500 MG PO TABS: 1000.0000 mg | ORAL_TABLET | Freq: Four times a day (QID) | ORAL | Status: DC

## 2023-07-15 MED ORDER — SODIUM CHLORIDE 0.9 % IV SOLN: INTRAVENOUS | Status: DC | PRN

## 2023-07-15 MED ORDER — HEPARIN SODIUM (PORCINE) 1000 UNIT/ML DIALYSIS
1000.0000 [IU] | INTRAMUSCULAR | Status: DC | PRN
  Administered 2023-07-15: 2400 [IU] via INTRAVENOUS_CENTRAL
  Filled 2023-07-15: qty 6
  Filled 2023-07-15: qty 3

## 2023-07-15 MED ORDER — FOLIC ACID 1 MG PO TABS: 1.0000 mg | ORAL_TABLET | Freq: Every day | ORAL | Status: DC

## 2023-07-15 MED ORDER — THIAMINE HCL 100 MG/ML IJ SOLN
100.0000 mg | Freq: Every day | INTRAMUSCULAR | Status: DC
  Administered 2023-07-16 – 2023-07-18 (×3): 100 mg via INTRAVENOUS
  Filled 2023-07-15 (×3): qty 2

## 2023-07-15 MED ORDER — CEFAZOLIN SODIUM-DEXTROSE 2-4 GM/100ML-% IV SOLN
2.0000 g | INTRAVENOUS | Status: AC
  Administered 2023-07-15: 2 g via INTRAVENOUS
  Filled 2023-07-15: qty 100

## 2023-07-15 MED ORDER — DOCUSATE SODIUM 50 MG/5ML PO LIQD: 100.0000 mg | Freq: Two times a day (BID) | ORAL | Status: DC

## 2023-07-15 MED ORDER — BISACODYL 10 MG RE SUPP
10.0000 mg | Freq: Every day | RECTAL | Status: DC
  Filled 2023-07-15: qty 1

## 2023-07-15 MED ORDER — HEPARIN 6000 UNIT IRRIGATION SOLUTION
Status: DC | PRN
  Administered 2023-07-15: 1

## 2023-07-15 MED ORDER — DOCUSATE SODIUM 50 MG/5ML PO LIQD
100.0000 mg | Freq: Two times a day (BID) | ORAL | Status: DC
  Administered 2023-07-16: 100 mg
  Filled 2023-07-15 (×2): qty 10

## 2023-07-15 MED ORDER — TRACE MINERALS CU-MN-SE-ZN 300-55-60-3000 MCG/ML IV SOLN
INTRAVENOUS | Status: AC
  Filled 2023-07-15: qty 400

## 2023-07-15 MED ORDER — DEXAMETHASONE SODIUM PHOSPHATE 10 MG/ML IJ SOLN
INTRAMUSCULAR | Status: AC
  Filled 2023-07-15: qty 1

## 2023-07-15 MED ORDER — HEPARIN SODIUM (PORCINE) 1000 UNIT/ML IJ SOLN
INTRAMUSCULAR | Status: AC
  Filled 2023-07-15: qty 10

## 2023-07-15 MED ORDER — CEFAZOLIN SODIUM-DEXTROSE 2-3 GM-%(50ML) IV SOLR
INTRAVENOUS | Status: DC | PRN
  Administered 2023-07-15: 2 g via INTRAVENOUS

## 2023-07-15 MED ORDER — PHENOBARBITAL 32.4 MG PO TABS
32.4000 mg | ORAL_TABLET | Freq: Three times a day (TID) | ORAL | Status: DC
  Filled 2023-07-15: qty 1

## 2023-07-15 MED ORDER — IODIXANOL 320 MG/ML IV SOLN
INTRAVENOUS | Status: DC | PRN
  Administered 2023-07-15: 6 mL via INTRA_ARTERIAL

## 2023-07-15 SURGICAL SUPPLY — 56 items
ADH SKN CLS APL DERMABOND .7 (GAUZE/BANDAGES/DRESSINGS) ×3
BAG COUNTER SPONGE SURGICOUNT (BAG) ×3 IMPLANT
BAG SPNG CNTER NS LX DISP (BAG) ×3
CANISTER SUCT 3000ML PPV (MISCELLANEOUS) ×3 IMPLANT
CANISTER WOUNDNEG PRESSURE 500 (CANNISTER) IMPLANT
CATH ACCU-VU SIZ PIG 5F 100CM (CATHETERS) IMPLANT
CATH SOS OMNI O 5F 80CM (CATHETERS) IMPLANT
CATH VISIONS PV .035 IVUS (CATHETERS) IMPLANT
CLOSURE MYNX CONTROL 5F (Vascular Products) IMPLANT
CLOSURE PERCLOSE PROSTYLE (VASCULAR PRODUCTS) IMPLANT
CNTNR URN SCR LID CUP LEK RST (MISCELLANEOUS) IMPLANT
CONT SPEC 4OZ STRL OR WHT (MISCELLANEOUS) ×3
COVER PROBE W GEL 5X96 (DRAPES) ×3 IMPLANT
COVER SURGICAL LIGHT HANDLE (MISCELLANEOUS) IMPLANT
DERMABOND ADVANCED .7 DNX12 (GAUZE/BANDAGES/DRESSINGS) ×3 IMPLANT
DRAPE LAPAROSCOPIC ABDOMINAL (DRAPES) IMPLANT
DRSG TEGADERM 2-3/8X2-3/4 SM (GAUZE/BANDAGES/DRESSINGS) ×3 IMPLANT
ELECT REM PT RETURN 9FT ADLT (ELECTROSURGICAL) ×6
ELECTRODE REM PT RTRN 9FT ADLT (ELECTROSURGICAL) ×6 IMPLANT
GAUZE SPONGE 2X2 8PLY STRL LF (GAUZE/BANDAGES/DRESSINGS) IMPLANT
GLIDEWIRE ADV .035X260CM (WIRE) IMPLANT
GLOVE BIO SURGEON STRL SZ7.5 (GLOVE) ×3 IMPLANT
GLOVE BIOGEL PI IND STRL 8 (GLOVE) ×3 IMPLANT
GLOVE SURG UNDER LTX SZ8 (GLOVE) ×3 IMPLANT
GOWN STRL REUS W/ TWL LRG LVL3 (GOWN DISPOSABLE) ×9 IMPLANT
GOWN STRL REUS W/TWL LRG LVL3 (GOWN DISPOSABLE) ×9
HANDLE SUCTION POOLE (INSTRUMENTS) IMPLANT
KIT BASIN OR (CUSTOM PROCEDURE TRAY) ×3 IMPLANT
KIT TURNOVER KIT B (KITS) ×3 IMPLANT
NS IRRIG 1000ML POUR BTL (IV SOLUTION) ×6 IMPLANT
PACK ENDOVASCULAR (PACKS) ×3 IMPLANT
PAD ARMBOARD 7.5X6 YLW CONV (MISCELLANEOUS) ×6 IMPLANT
SHEATH DRYSEAL FLEX 20FR 33CM (SHEATH) IMPLANT
SHEATH PINNACLE 5F 10CM (SHEATH) IMPLANT
SHEATH PINNACLE 8F 10CM (SHEATH) IMPLANT
SLEEVE ISOL F/PACE RF HD COVER (MISCELLANEOUS) IMPLANT
SPONGE ABDOMINAL VAC ABTHERA (MISCELLANEOUS) IMPLANT
STENT GRFT THORAC ACS 28X28X10 (Endovascular Graft) IMPLANT
STOPCOCK MORSE 400PSI 3WAY (MISCELLANEOUS) ×3 IMPLANT
SUCTION POOLE HANDLE (INSTRUMENTS) ×3
SUT MNCRL AB 4-0 PS2 18 (SUTURE) ×6 IMPLANT
SUT PDS AB 1 TP1 96 (SUTURE) IMPLANT
SUT PROLENE 5 0 C 1 24 (SUTURE) IMPLANT
SUT SILK 2 0 SH CR/8 (SUTURE) IMPLANT
SUT SILK 2 0 TIES 10X30 (SUTURE) IMPLANT
SUT SILK 3 0 SH CR/8 (SUTURE) IMPLANT
SUT SILK 3 0 TIES 10X30 (SUTURE) IMPLANT
SUT VIC AB 2-0 CTB1 (SUTURE) IMPLANT
SUT VIC AB 3-0 SH 27 (SUTURE)
SUT VIC AB 3-0 SH 27X BRD (SUTURE) IMPLANT
SYR 30ML LL (SYRINGE) IMPLANT
SYR 50ML LL SCALE MARK (SYRINGE) ×3 IMPLANT
TOWEL GREEN STERILE (TOWEL DISPOSABLE) ×3 IMPLANT
TUBING HIGH PRESSURE 120CM (CONNECTOR) ×3 IMPLANT
WIRE BENTSON .035X145CM (WIRE) IMPLANT
WIRE STIFF LUNDERQUIST 260CM (WIRE) IMPLANT

## 2023-07-15 NOTE — Progress Notes (Signed)
PT Cancellation Note  Patient Details Name: Jerry Daniels MRN: [id number] DOB: October 14, 1956   Cancelled Treatment:    Reason Eval/Treat Not Completed: (P) Patient not medically ready. Planning to go to OR for stent placement today. RN requesting hold off on PT remainder of day as well. Will plan to follow-up another day as able.   Raymond Gurney, PT, DPT Acute Rehabilitation Services  Office: [phone]    Jewel Baize 07/14/2023, 10:03 AM
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
## 2023-07-15 NOTE — Progress Notes (Signed)
OT Cancellation Note  Patient Details Name: Jerry Daniels MRN: [id number] DOB: 1956-08-13   Cancelled Treatment:    Reason Eval/Treat Not Completed: Patient not medically ready Pt pending OR today for stent placement with bedrest planned post procedure. OT to check back at a more appropriate time.   Mateo Flow 07/29/2023, 10:24 AM

## 2023-07-15 NOTE — Progress Notes (Signed)
Pt transported to CT and back to 4N23 without any complications.  
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
## 2023-07-15 NOTE — Anesthesia Procedure Notes (Signed)
Arterial Line Insertion Start/End08/13/2024 10:50 AM, 07/15/2023 10:55 AM Performed by: Rachel Moulds, CRNA, CRNA  Preanesthetic checklist: patient identified, IV checked, site marked, risks and benefits discussed, surgical consent, monitors and equipment checked, pre-op evaluation, timeout performed and anesthesia consent Lidocaine 1% used for infiltration Left, radial was placed Catheter size: 20 G Allen's test indicative of satisfactory collateral circulation Attempts: 3 Procedure performed without using ultrasound guided technique. Following insertion, Biopatch and dressing applied. Post procedure assessment: normal  Post procedure complications: unsuccessful attempts. Patient tolerated the procedure well with no immediate complications.
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
## 2023-07-15 NOTE — Progress Notes (Signed)
Peripherally Inserted Central Catheter Placement  The IV Nurse has discussed with the patient and/or persons authorized to consent for the patient, the purpose of this procedure and the potential benefits and risks involved with this procedure.  The benefits include less needle sticks, lab draws from the catheter, and the patient may be discharged home with the catheter. Risks include, but not limited to, infection, bleeding, blood clot (thrombus formation), and puncture of an artery; nerve damage and irregular heartbeat and possibility to perform a PICC exchange if needed/ordered by physician.  Alternatives to this procedure were also discussed.  Bard Power PICC patient education guide, fact sheet on infection prevention and patient information card has been provided to patient /or left at bedside.  Consent obtained via telephone with daughter.    PICC Placement Documentation  PICC Triple Lumen 08/04/2023 Right Brachial 41 cm 0 cm (Active)  Indication for Insertion or Continuance of Line Administration of hyperosmolar/irritating solutions (i.e. TPN, Vancomycin, etc.) 07/14/2023 1800  Exposed Catheter (cm) 0 cm 08/06/2023 1800  Site Assessment Clean, Dry, Intact 08/07/2023 1800  Lumen #1 Status Flushed;Saline locked;Blood return noted 08/13/2023 1800  Lumen #2 Status Flushed;Saline locked;Blood return noted 07/20/2023 1800  Lumen #3 Status Flushed;Saline locked;Blood return noted 07/21/2023 1800  Dressing Type Transparent;Securing device 08/11/2023 1800  Dressing Status Antimicrobial disc in place;Clean, Dry, Intact 07/14/2023 1800  Safety Lock Not Applicable 07/16/2023 1800  Line Care Connections checked and tightened 08/08/2023 1800  Line Adjustment (NICU/IV Team Only) No 07/28/2023 1800  Dressing Intervention New dressing 08/13/2023 1800  Dressing Change Due 07/22/23 08/06/2023 1800       Timmothy Sours 08/01/2023, 6:19 PM

## 2023-07-15 NOTE — Progress Notes (Addendum)
Called by anesthesia post-op from aortic repair for increased abdominal distention, worsening ability to ventilate and increased peak pressures of 42. Abdominal compartment pressures measured and elevated. In light of this and worsening oliguria overnight, decision made to proceed with decompressive laparotomy. Informed consent obtained via phone from patient's two daughters.   Diamantina Monks, MD General and Trauma Surgery [facility]

## 2023-07-15 NOTE — Progress Notes (Signed)
Patient examined at bedside. Resting comfortably, propofol added, no vent asynchrony. Oxygenating well. UOP has been low, giving small fluid bolus. Abdomen distended, KUB pending.

## 2023-07-15 NOTE — Transfer of Care (Signed)
Immediate Anesthesia Transfer of Care Note  Patient: Jerry Daniels  Procedure(s) Performed: THORACIC AORTIC ENDOVASCULAR STENT GRAFT INTRAVASCULAR ULTRASOUND, distal thoracic aorta to abdominal aorta (Abdomen) EXPLORATION LAPAROTOMY (Abdomen) APPLICATION OF ABDOMINAL WOUND VAC (Bilateral: Abdomen)  Patient Location: ICU  Anesthesia Type:General  Level of Consciousness: Patient remains intubated per anesthesia plan  Airway & Oxygen Therapy: Patient remains intubated per anesthesia plan and Patient placed on Ventilator (see vital sign flow sheet for setting)  Post-op Assessment: Report given to RN and Post -op Vital signs reviewed and stable  Post vital signs: Reviewed and stable  Last Vitals:  Vitals Value Taken Time  BP 129/63   Temp    Pulse 69   Resp 16   SpO2 93     Last Pain:  Vitals:   07/16/2023 0800  TempSrc: Axillary  PainSc:          Complications: No notable events documented.

## 2023-07-15 NOTE — Anesthesia Procedure Notes (Signed)
Central Venous Catheter Insertion Performed by: Val Eagle, MD, anesthesiologist Start/End08/14/2024 10:33 AM, 07/15/2023 10:43 AM Patient location: Pre-op. Preanesthetic checklist: patient identified, IV checked, site marked, risks and benefits discussed, monitors and equipment checked, pre-op evaluation, timeout performed and anesthesia consent Position: supine Hand hygiene performed  and maximum sterile barriers used  Catheter size: 8 Fr Total catheter length 16. Central line was placed.Double lumen Procedure performed using ultrasound guided technique. Ultrasound Notes:anatomy identified, needle tip was noted to be adjacent to the nerve/plexus identified, no ultrasound evidence of intravascular and/or intraneural injection and image(s) printed for medical record Attempts: 1 Following insertion, dressing applied, line sutured and Biopatch. Post procedure assessment: blood return through all ports and free fluid flow  Patient tolerated the procedure well with no immediate complications.

## 2023-07-15 NOTE — Procedures (Signed)
   Procedure Note  Date: 08/12/2023  Procedure: central venous catheter placement--right, internal jugular vein, without ultrasound guidance  Pre-op diagnosis: renal dysfunction, need for initiation of renal replacement therapy Post-op diagnosis: same  Surgeon: Diamantina Monks, MD  Anesthesia: local  EBL: <5cc Drains/Implants: triple lumen central venous catheter  Description of procedure: Time-out was performed verifying correct patient, procedure, site, laterality, and signature of informed consent. The right neck was prepped and draped in the usual sterile fashion. A guidewire was passed through the existing cathter and the catheter removed. The tract was dilated and the trialysis central venous catheter advanced over the guidewire followed by removal of the guidewire. All ports drew blood easily and all were flushed with saline. The catheter was secured to the skin with suture and a sterile dressing. The patient tolerated the procedure well. There were no immediate complications. Follow up chest x-ray was ordered to confirm positioning and the absence of a pneumothorax.   Diamantina Monks, MD General and Trauma Surgery [facility] Surgery

## 2023-07-15 NOTE — Progress Notes (Signed)
Clinical update provided to patient's two daughters, mother, sister, and other family. Discussed again the identification of abdominal compartment syndrome resulting in indication for laparotomy. Also advised of anticipated need for RRT within the next 24-48h. Daughter [name] had asked yesterday about tracheostomy and stated patient would likely not have been agreeable with that. We discussed his MSOF: neurologic, renal, hepatic, pulmonary and the fact that increased number of dysfunctional systems increases the likelihood of disability/death. Advised they urgently consider whether they would consent to RRT and if so, I would recommend CVC placement today vs tomorrow. They requested some time to think. Will follow up.   Critical care time:  Diamantina Monks, MD General and Trauma Surgery [facility] Surgery

## 2023-07-15 NOTE — Op Note (Signed)
   Operative Note   Date: 08/08/2023  Procedure: decompressive laparotomy, abthera negative pressure wound dressing  Pre-op diagnosis: abdominal compartment syndrome Post-op diagnosis: abdominal compartment syndrome, colonic distention with multiple serosal tears of the transverse colon   Indication and clinical history: The patient is a 67 y.o. year old male with suspected abdominal compartment syndrome.    Surgeon: Diamantina Monks, MD  Anesthesiologist: Maple Hudson, MD Anesthesia: General  Findings:  Specimen: none EBL: <5cc Drains/Implants: none  Disposition: ICU - intubated and hemodynamically stable.  Description of procedure: The patient was positioned supine on the operating room table. General anesthetic induction and intubation were uneventful. Foley catheter insertion was performed and was atraumatic. Time-out was performed verifying correct patient, procedure, signature of informed consent, and administration of pre-operative antibiotics. The abdomen was prepped and draped in the usual sterile fashion.  A midline incision was made and deepened through the fascia. Immediate improvement of peak pressures from 42 to 34 occurred. The abdomen was explored and the small bowel and colon appeared viable. The colon was distended with numerous serosal tears, presumably from the extreme distention. A negative pressure wound dressing was applied with good seal.   All sponge and instrument counts were correct at the conclusion of the procedure. The patient was transported to the ICU in stable condition. There were no complications.    Diamantina Monks, MD General and Trauma Surgery [facility] Surgery

## 2023-07-15 NOTE — Progress Notes (Signed)
Family elects for continuation of maximal medical therapy at this point, would like to proceed with RRT as indicated. Will place trialysis this PM in anticipation, send BMP, and give 20mg  of lasix. Clinically consistent with ARDS, dropped goal sat to 88%, incr PEEP to 12, incr RR to 24, and decr TV to 6cc/kg. Improved peak pressures with these changes, repeat ABG in 1h.   Additional critical care time:  Diamantina Monks, MD General and Trauma Surgery [facility] Surgery

## 2023-07-16 LAB — GLUCOSE, CAPILLARY
Glucose-Capillary: 208 mg/dL — ABNORMAL HIGH (ref 70–99)
Glucose-Capillary: 170 mg/dL — ABNORMAL HIGH (ref 70–99)
Glucose-Capillary: 181 mg/dL — ABNORMAL HIGH (ref 70–99)
Glucose-Capillary: 160 mg/dL — ABNORMAL HIGH (ref 70–99)
Glucose-Capillary: 159 mg/dL — ABNORMAL HIGH (ref 70–99)
Glucose-Capillary: 171 mg/dL — ABNORMAL HIGH (ref 70–99)

## 2023-07-16 MED ORDER — FUROSEMIDE 10 MG/ML IJ SOLN
20.0000 mg | Freq: Once | INTRAMUSCULAR | Status: AC
  Administered 2023-07-16: 20 mg via INTRAVENOUS
  Filled 2023-07-16: qty 2

## 2023-07-16 MED ORDER — POTASSIUM CHLORIDE 10 MEQ/50ML IV SOLN
10.0000 meq | INTRAVENOUS | Status: AC
  Administered 2023-07-16 (×4): 10 meq via INTRAVENOUS
  Filled 2023-07-16 (×4): qty 50

## 2023-07-16 MED ORDER — TRACE MINERALS CU-MN-SE-ZN 300-55-60-3000 MCG/ML IV SOLN
INTRAVENOUS | Status: AC
  Filled 2023-07-16: qty 600.07

## 2023-07-16 MED ORDER — PHENOBARBITAL SODIUM 65 MG/ML IJ SOLN
32.5000 mg | Freq: Three times a day (TID) | INTRAMUSCULAR | Status: AC
  Administered 2023-07-16 – 2023-07-17 (×5): 32.5 mg via INTRAVENOUS
  Filled 2023-07-16 (×5): qty 1
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
## 2023-07-16 NOTE — Progress Notes (Signed)
Occupational Therapy Discharge Patient Details Name: MARKCUS LAZENBY MRN: [id number] DOB: 1956/05/31 Today's Date: 07/16/2023 Time:  -     Patient discharged from OT services secondary to medical decline - will need to re-order OT to resume therapy services.  Please see latest therapy progress note for current level of functioning and progress toward goals.    Progress and discharge plan discussed with patient and/or caregiver: Patient unable to participate in discharge planning and no caregivers available  GO     Mateo Flow 07/16/2023, 8:56 AM
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
## 2023-07-16 NOTE — Progress Notes (Signed)
PT Cancellation Note  Patient Details Name: Jerry Daniels MRN: [id number] DOB: 01-04-1956   Cancelled Treatment:    Reason Eval/Treat Not Completed: Patient not medically ready, per discussion with RN will sign off and await new orders when medically stable for PT evaluation.   Vickki Muff, PT, DPT   Acute Rehabilitation Department Office [phone] Secure Chat Communication Preferred   Ronnie Derby 07/16/2023, 8:51 AM
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
## 2023-07-17 ENCOUNTER — Encounter (HOSPITAL_COMMUNITY): Payer: Self-pay | Admitting: Vascular Surgery

## 2023-07-17 ENCOUNTER — Other Ambulatory Visit: Payer: Self-pay

## 2023-07-17 ENCOUNTER — Inpatient Hospital Stay (HOSPITAL_COMMUNITY): Payer: PPO | Admitting: Anesthesiology

## 2023-07-17 ENCOUNTER — Encounter (HOSPITAL_COMMUNITY): Admission: EM | Disposition: E | Payer: Self-pay | Source: Home / Self Care

## 2023-07-17 DIAGNOSIS — T79A3XA Traumatic compartment syndrome of abdomen, initial encounter: Secondary | ICD-10-CM

## 2023-07-17 DIAGNOSIS — K55049 Acute infarction of large intestine, extent unspecified: Secondary | ICD-10-CM

## 2023-07-17 DIAGNOSIS — E782 Mixed hyperlipidemia: Secondary | ICD-10-CM | POA: Diagnosis not present

## 2023-07-17 HISTORY — PX: LAPAROTOMY: SHX154

## 2023-07-17 LAB — CBC
HCT: 31.6 % — ABNORMAL LOW (ref 39.0–52.0)
HCT: 35.5 % — ABNORMAL LOW (ref 39.0–52.0)
HCT: 34.8 % — ABNORMAL LOW (ref 39.0–52.0)
Hemoglobin: 10.2 g/dL — ABNORMAL LOW (ref 13.0–17.0)
Hemoglobin: 11.1 g/dL — ABNORMAL LOW (ref 13.0–17.0)
Hemoglobin: 10.9 g/dL — ABNORMAL LOW (ref 13.0–17.0)
MCH: 33.1 pg (ref 26.0–34.0)
MCH: 33.2 pg (ref 26.0–34.0)
MCH: 33.6 pg (ref 26.0–34.0)
MCHC: 32.3 g/dL (ref 30.0–36.0)
MCHC: 31.3 g/dL (ref 30.0–36.0)
MCHC: 31.3 g/dL (ref 30.0–36.0)
MCV: 102.6 fL — ABNORMAL HIGH (ref 80.0–100.0)
MCV: 106.3 fL — ABNORMAL HIGH (ref 80.0–100.0)
MCV: 107.4 fL — ABNORMAL HIGH (ref 80.0–100.0)
Platelets: 292 10*3/uL (ref 150–400)
Platelets: 366 10*3/uL (ref 150–400)
Platelets: 485 10*3/uL — ABNORMAL HIGH (ref 150–400)
RBC: 3.08 MIL/uL — ABNORMAL LOW (ref 4.22–5.81)
RBC: 3.34 MIL/uL — ABNORMAL LOW (ref 4.22–5.81)
RBC: 3.24 MIL/uL — ABNORMAL LOW (ref 4.22–5.81)
RDW: 14.7 % (ref 11.5–15.5)
RDW: 14.8 % (ref 11.5–15.5)
RDW: 15.2 % (ref 11.5–15.5)
WBC: 12.4 10*3/uL — ABNORMAL HIGH (ref 4.0–10.5)
WBC: 7.1 10*3/uL (ref 4.0–10.5)
WBC: 38 10*3/uL — ABNORMAL HIGH (ref 4.0–10.5)
nRBC: 0.6 % — ABNORMAL HIGH (ref 0.0–0.2)
nRBC: 2.8 % — ABNORMAL HIGH (ref 0.0–0.2)
nRBC: 1.2 % — ABNORMAL HIGH (ref 0.0–0.2)

## 2023-07-17 LAB — POCT I-STAT 7, (LYTES, BLD GAS, ICA,H+H)
Acid-base deficit: 1 mmol/L (ref 0.0–2.0)
Bicarbonate: 25.8 mmol/L (ref 20.0–28.0)
Calcium, Ion: 1.15 mmol/L (ref 1.15–1.40)
HCT: 27 % — ABNORMAL LOW (ref 39.0–52.0)
Hemoglobin: 9.2 g/dL — ABNORMAL LOW (ref 13.0–17.0)
O2 Saturation: 94 %
Patient temperature: 37.2
Potassium: 4 mmol/L (ref 3.5–5.1)
Sodium: 147 mmol/L — ABNORMAL HIGH (ref 135–145)
TCO2: 27 mmol/L (ref 22–32)
pCO2 arterial: 49.9 mmHg — ABNORMAL HIGH (ref 32–48)
pH, Arterial: 7.322 — ABNORMAL LOW (ref 7.35–7.45)
pO2, Arterial: 76 mmHg — ABNORMAL LOW (ref 83–108)

## 2023-07-17 LAB — GLUCOSE, CAPILLARY
Glucose-Capillary: 148 mg/dL — ABNORMAL HIGH (ref 70–99)
Glucose-Capillary: 161 mg/dL — ABNORMAL HIGH (ref 70–99)
Glucose-Capillary: 133 mg/dL — ABNORMAL HIGH (ref 70–99)
Glucose-Capillary: 139 mg/dL — ABNORMAL HIGH (ref 70–99)
Glucose-Capillary: 186 mg/dL — ABNORMAL HIGH (ref 70–99)

## 2023-07-17 LAB — HEPATIC FUNCTION PANEL
ALT: 52 U/L — ABNORMAL HIGH (ref 0–44)
AST: 49 U/L — ABNORMAL HIGH (ref 15–41)
Albumin: 1.7 g/dL — ABNORMAL LOW (ref 3.5–5.0)
Alkaline Phosphatase: 68 U/L (ref 38–126)
Bilirubin, Direct: 0.4 mg/dL — ABNORMAL HIGH (ref 0.0–0.2)
Indirect Bilirubin: 0.4 mg/dL (ref 0.3–0.9)
Total Bilirubin: 0.8 mg/dL (ref 0.3–1.2)
Total Protein: 5.3 g/dL — ABNORMAL LOW (ref 6.5–8.1)

## 2023-07-17 LAB — AMMONIA: Ammonia: 41 umol/L — ABNORMAL HIGH (ref 9–35)

## 2023-07-17 LAB — BLOOD GAS, ARTERIAL
Acid-Base Excess: 1.5 mmol/L (ref 0.0–2.0)
Bicarbonate: 27.2 mmol/L (ref 20.0–28.0)
O2 Saturation: 100 %
Patient temperature: 37.3
pCO2 arterial: 47 mmHg (ref 32–48)
pH, Arterial: 7.38 (ref 7.35–7.45)
pO2, Arterial: 135 mmHg — ABNORMAL HIGH (ref 83–108)

## 2023-07-17 LAB — MAGNESIUM: Magnesium: 3.1 mg/dL — ABNORMAL HIGH (ref 1.7–2.4)

## 2023-07-17 LAB — BASIC METABOLIC PANEL WITH GFR
Anion gap: 11 (ref 5–15)
BUN: 39 mg/dL — ABNORMAL HIGH (ref 8–23)
CO2: 25 mmol/L (ref 22–32)
Calcium: 7.7 mg/dL — ABNORMAL LOW (ref 8.9–10.3)
Chloride: 109 mmol/L (ref 98–111)
Creatinine, Ser: 1.27 mg/dL — ABNORMAL HIGH (ref 0.61–1.24)
GFR, Estimated: >60 mL/min (ref >60)
Glucose, Bld: 173 mg/dL — ABNORMAL HIGH (ref 70–99)
Potassium: 4 mmol/L (ref 3.5–5.1)
Sodium: 145 mmol/L (ref 135–145)

## 2023-07-17 LAB — PHOSPHORUS: Phosphorus: 1.6 mg/dL — ABNORMAL LOW (ref 2.5–4.6)

## 2023-07-17 SURGERY — LAPAROTOMY, EXPLORATORY
Anesthesia: General | Site: Abdomen

## 2023-07-17 MED ORDER — ALBUMIN HUMAN 5 % IV SOLN
12.5000 g | Freq: Once | INTRAVENOUS | Status: AC
  Administered 2023-07-17: 12.5 g via INTRAVENOUS

## 2023-07-17 MED ORDER — FENTANYL CITRATE (PF) 250 MCG/5ML IJ SOLN
INTRAMUSCULAR | Status: DC | PRN
  Administered 2023-07-17 (×2): 50 ug via INTRAVENOUS

## 2023-07-17 MED ORDER — CEFAZOLIN SODIUM-DEXTROSE 2-3 GM-%(50ML) IV SOLR
INTRAVENOUS | Status: DC | PRN
  Administered 2023-07-17: 2 g via INTRAVENOUS

## 2023-07-17 MED ORDER — SODIUM CHLORIDE 0.9 % IV BOLUS
1000.0000 mL | Freq: Once | INTRAVENOUS | Status: AC
  Administered 2023-07-17: 1000 mL via INTRAVENOUS

## 2023-07-17 MED ORDER — SODIUM CHLORIDE (PF) 0.9 % IJ SOLN
INTRAMUSCULAR | Status: AC
  Filled 2023-07-17: qty 20

## 2023-07-17 MED ORDER — ROCURONIUM BROMIDE 10 MG/ML (PF) SYRINGE
PREFILLED_SYRINGE | INTRAVENOUS | Status: AC
  Filled 2023-07-17: qty 10

## 2023-07-17 MED ORDER — PROPOFOL 10 MG/ML IV BOLUS
INTRAVENOUS | Status: AC
  Filled 2023-07-17: qty 20

## 2023-07-17 MED ORDER — PIPERACILLIN-TAZOBACTAM 3.375 G IVPB
3.3750 g | Freq: Three times a day (TID) | INTRAVENOUS | Status: DC
  Administered 2023-07-17 – 2023-07-18 (×3): 3.375 g via INTRAVENOUS
  Filled 2023-07-17 (×3): qty 50

## 2023-07-17 MED ORDER — ALBUMIN HUMAN 5 % IV SOLN: INTRAVENOUS | Status: DC | PRN

## 2023-07-17 MED ORDER — FENTANYL CITRATE (PF) 250 MCG/5ML IJ SOLN
INTRAMUSCULAR | Status: AC
  Filled 2023-07-17: qty 5

## 2023-07-17 MED ORDER — TRACE MINERALS CU-MN-SE-ZN 300-55-60-3000 MCG/ML IV SOLN
INTRAVENOUS | Status: DC
  Filled 2023-07-17: qty 902.4

## 2023-07-17 MED ORDER — MIDAZOLAM HCL 2 MG/2ML IJ SOLN
INTRAMUSCULAR | Status: DC | PRN
  Administered 2023-07-17: 2 mg via INTRAVENOUS

## 2023-07-17 MED ORDER — PIPERACILLIN-TAZOBACTAM 3.375 G IVPB 30 MIN: 3.3750 g | Freq: Three times a day (TID) | INTRAVENOUS | Status: DC

## 2023-07-17 MED ORDER — 0.9 % SODIUM CHLORIDE (POUR BTL) OPTIME
TOPICAL | Status: DC | PRN
  Administered 2023-07-17: 2000 mL

## 2023-07-17 MED ORDER — ALBUMIN HUMAN 5 % IV SOLN
INTRAVENOUS | Status: AC
  Filled 2023-07-17: qty 250

## 2023-07-17 MED ORDER — CEFAZOLIN SODIUM 1 G IJ SOLR
INTRAMUSCULAR | Status: AC
  Filled 2023-07-17: qty 20

## 2023-07-17 MED ORDER — MIDAZOLAM HCL 2 MG/2ML IJ SOLN
INTRAMUSCULAR | Status: AC
  Filled 2023-07-17: qty 2

## 2023-07-17 MED ORDER — POTASSIUM PHOSPHATES 15 MMOLE/5ML IV SOLN
15.0000 mmol | Freq: Once | INTRAVENOUS | Status: AC
  Administered 2023-07-17: 15 mmol via INTRAVENOUS
  Filled 2023-07-17: qty 5

## 2023-07-17 MED ORDER — VASOPRESSIN 20 UNITS/100 ML INFUSION FOR SHOCK
0.0000 [IU]/min | INTRAVENOUS | Status: DC
  Administered 2023-07-17 – 2023-07-18 (×2): 0.03 [IU]/min via INTRAVENOUS
  Filled 2023-07-17: qty 100

## 2023-07-17 MED ORDER — LIDOCAINE 2% (20 MG/ML) 5 ML SYRINGE
INTRAMUSCULAR | Status: AC
  Filled 2023-07-17: qty 5

## 2023-07-17 MED ORDER — VASOPRESSIN 20 UNITS/100 ML INFUSION FOR SHOCK
INTRAVENOUS | Status: AC
  Administered 2023-07-17: 0.03 [IU]/min via INTRAVENOUS
  Filled 2023-07-17: qty 100

## 2023-07-17 MED ORDER — ROCURONIUM BROMIDE 10 MG/ML (PF) SYRINGE
PREFILLED_SYRINGE | INTRAVENOUS | Status: DC | PRN
  Administered 2023-07-17: 50 mg via INTRAVENOUS
  Administered 2023-07-17: 100 mg via INTRAVENOUS
  Administered 2023-07-17: 50 mg via INTRAVENOUS

## 2023-07-17 SURGICAL SUPPLY — 45 items
APL PRP STRL LF DISP 70% ISPRP (MISCELLANEOUS) ×1
BAG COUNTER SPONGE SURGICOUNT (BAG) ×1 IMPLANT
BAG SPNG CNTER NS LX DISP (BAG) ×1
BLADE CLIPPER SURG (BLADE) IMPLANT
CANISTER SUCT 3000ML PPV (MISCELLANEOUS) ×1 IMPLANT
CHLORAPREP W/TINT 26 (MISCELLANEOUS) ×1 IMPLANT
COVER SURGICAL LIGHT HANDLE (MISCELLANEOUS) ×1 IMPLANT
DRAPE LAPAROSCOPIC ABDOMINAL (DRAPES) ×1 IMPLANT
DRAPE WARM FLUID 44X44 (DRAPES) ×1 IMPLANT
DRSG OPSITE POSTOP 4X10 (GAUZE/BANDAGES/DRESSINGS) IMPLANT
DRSG OPSITE POSTOP 4X8 (GAUZE/BANDAGES/DRESSINGS) IMPLANT
ELECT BLADE 6.5 EXT (BLADE) IMPLANT
ELECT CAUTERY BLADE 6.4 (BLADE) ×1 IMPLANT
ELECT REM PT RETURN 9FT ADLT (ELECTROSURGICAL) ×1
ELECTRODE REM PT RTRN 9FT ADLT (ELECTROSURGICAL) ×1 IMPLANT
GLOVE BIO SURGEON STRL SZ8 (GLOVE) ×1 IMPLANT
GLOVE BIOGEL PI IND STRL 8 (GLOVE) ×1 IMPLANT
GOWN STRL REUS W/ TWL LRG LVL3 (GOWN DISPOSABLE) ×1 IMPLANT
GOWN STRL REUS W/ TWL XL LVL3 (GOWN DISPOSABLE) ×1 IMPLANT
GOWN STRL REUS W/TWL LRG LVL3 (GOWN DISPOSABLE) ×1
GOWN STRL REUS W/TWL XL LVL3 (GOWN DISPOSABLE) ×1
HANDLE SUCTION POOLE (INSTRUMENTS) ×1 IMPLANT
KIT BASIN OR (CUSTOM PROCEDURE TRAY) ×1 IMPLANT
KIT TURNOVER KIT B (KITS) ×1 IMPLANT
LIGASURE IMPACT 36 18CM CVD LR (INSTRUMENTS) IMPLANT
NS IRRIG 1000ML POUR BTL (IV SOLUTION) ×2 IMPLANT
PACK GENERAL/GYN (CUSTOM PROCEDURE TRAY) ×1 IMPLANT
PAD ARMBOARD 7.5X6 YLW CONV (MISCELLANEOUS) ×1 IMPLANT
PENCIL SMOKE EVACUATOR (MISCELLANEOUS) ×1 IMPLANT
RELOAD PROXIMATE 75MM BLUE (ENDOMECHANICALS) ×1 IMPLANT
RELOAD STAPLE 75 3.8 BLU REG (ENDOMECHANICALS) IMPLANT
SPECIMEN JAR LARGE (MISCELLANEOUS) IMPLANT
SPONGE ABDOMINAL VAC ABTHERA (MISCELLANEOUS) IMPLANT
SPONGE T-LAP 18X18 ~~LOC~~+RFID (SPONGE) IMPLANT
STAPLER PROXIMATE 75MM BLUE (STAPLE) IMPLANT
STAPLER VISISTAT 35W (STAPLE) ×1 IMPLANT
SUCTION POOLE HANDLE (INSTRUMENTS) ×1
SUT PDS AB 1 TP1 96 (SUTURE) ×2 IMPLANT
SUT SILK 2 0 SH CR/8 (SUTURE) ×1 IMPLANT
SUT SILK 2 0 TIES 10X30 (SUTURE) ×1 IMPLANT
SUT SILK 3 0 SH CR/8 (SUTURE) ×1 IMPLANT
SUT SILK 3 0 TIES 10X30 (SUTURE) ×1 IMPLANT
TOWEL GREEN STERILE (TOWEL DISPOSABLE) ×1 IMPLANT
TRAY FOLEY MTR SLVR 16FR STAT (SET/KITS/TRAYS/PACK) IMPLANT
YANKAUER SUCT BULB TIP NO VENT (SUCTIONS) IMPLANT
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
## 2023-07-17 NOTE — Anesthesia Procedure Notes (Signed)
Date/Time: 08/09/2023 7:50 AM  Performed by: Nils Pyle, CRNAPre-anesthesia Checklist: Patient identified, Emergency Drugs available, Suction available, Patient being monitored and Timeout performed Patient Re-evaluated:Patient Re-evaluated prior to induction Oxygen Delivery Method: Circle system utilized Preoxygenation: Pre-oxygenation with 100% oxygen Induction Type: Inhalational induction with existing ETT Placement Confirmation: positive ETCO2 and breath sounds checked- equal and bilateral Dental Injury: Teeth and Oropharynx as per pre-operative assessment
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
## 2023-07-17 NOTE — Anesthesia Postprocedure Evaluation (Signed)
Anesthesia Post Note  Patient: Jerry Daniels  Procedure(s) Performed: EXPLORATION LAPAROTOMY WITH EXTENDED RIGHT COLECTOMY AND PARTIAL OMENTECTOMY WITH WOUND VAC DRESSING CHANGE (Abdomen)     Patient location during evaluation: SICU Anesthesia Type: General Level of consciousness: sedated Pain management: pain level controlled Vital Signs Assessment: post-procedure vital signs reviewed and stable Respiratory status: patient remains intubated per anesthesia plan Cardiovascular status: stable Postop Assessment: no apparent nausea or vomiting Anesthetic complications: no  No notable events documented.  Last Vitals:  Vitals:   08/03/2023 1030 07/16/2023 1100  BP:  124/63  Pulse:    Resp: (!) 24 (!) 24  Temp: 36.7 C 36.9 C  SpO2:  100%    Last Pain:  Vitals:   07/16/2023 0700  TempSrc: Bladder  PainSc:                  Brandun Pinn,W. EDMOND

## 2023-07-17 NOTE — Anesthesia Preprocedure Evaluation (Signed)
Anesthesia Evaluation  Patient identified by MRN, date of birth, ID band Patient unresponsive    Reviewed: Allergy & Precautions, H&P , NPO status , Patient's Chart, lab work & pertinent test results  Airway Mallampati: Intubated       Dental no notable dental hx. (+) Poor Dentition, Dental Advisory Given   Pulmonary neg pulmonary ROS Intubated and sedated   Pulmonary exam normal breath sounds clear to auscultation       Cardiovascular negative cardio ROS  Rhythm:Regular Rate:Normal     Neuro/Psych negative neurological ROS  negative psych ROS   GI/Hepatic negative GI ROS, Neg liver ROS,,,  Endo/Other  negative endocrine ROS    Renal/GU negative Renal ROS  negative genitourinary   Musculoskeletal   Abdominal   Peds  Hematology negative hematology ROS (+)   Anesthesia Other Findings   Reproductive/Obstetrics negative OB ROS                             Anesthesia Physical Anesthesia Plan  ASA: 4  Anesthesia Plan: General   Post-op Pain Management:    Induction: Intravenous  PONV Risk Score and Plan: 2 and Ondansetron and Dexamethasone  Airway Management Planned: Oral ETT  Additional Equipment:   Intra-op Plan:   Post-operative Plan: Post-operative intubation/ventilation  Informed Consent: I have reviewed the patients History and Physical, chart, labs and discussed the procedure including the risks, benefits and alternatives for the proposed anesthesia with the patient or authorized representative who has indicated his/her understanding and acceptance.     Dental advisory given  Plan Discussed with: CRNA  Anesthesia Plan Comments:        Anesthesia Quick Evaluation

## 2023-07-17 NOTE — Progress Notes (Addendum)
Notified Dr. Janee Morn of patient's increasing vasopressor needs after surgery this morning. CBC ordered and sent STAT, additional albumin ordered to follow albumin currently infusing.   Beryl Meager, RN

## 2023-07-17 NOTE — Progress Notes (Signed)
Pt transported from the OR to 4N 23 on the ventilator without incident.
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
## 2023-07-17 NOTE — Transfer of Care (Signed)
Immediate Anesthesia Transfer of Care Note  Patient: Jerry Daniels  Procedure(s) Performed: EXPLORATION LAPAROTOMY WITH EXTENDED RIGHT COLECTOMY AND PARTIAL OMENTECTOMY WITH WOUND VAC DRESSING CHANGE (Abdomen)  Patient Location: ICU  Anesthesia Type:General  Level of Consciousness: sedated and Patient remains intubated per anesthesia plan  Airway & Oxygen Therapy: Patient remains intubated per anesthesia plan and Patient placed on Ventilator (see vital sign flow sheet for setting)  Post-op Assessment: Report given to RN and Post -op Vital signs reviewed and stable  Post vital signs: Reviewed and stable  Last Vitals:  Vitals Value Taken Time  BP 117/76   Temp 36.3 C 08/07/2023 0947  Pulse 96 08/13/2023 0946  Resp 24 07/15/2023 0947  SpO2 100 % 07/15/2023 0946  Vitals shown include unfiled device data.  Last Pain:  Vitals:   08/12/2023 0700  TempSrc: Bladder  PainSc:          Complications: No notable events documented.

## 2023-07-17 NOTE — Progress Notes (Signed)
Patient ID: Jerry Daniels, male   DOB: February 01, 1956, 67 y.o.   MRN: [id number] Septic shock from ischemic colon. Fortunately, colon is out. Hb 11, Further volume resuscitation. Add vasopressin. I spoke with his daughters at the bedside.  Violeta Gelinas, MD, MPH, FACS Please use [email or web address] to contact on call provider

## 2023-07-18 ENCOUNTER — Encounter (HOSPITAL_COMMUNITY): Payer: Self-pay | Admitting: General Surgery

## 2023-07-18 DIAGNOSIS — T07XXXA Unspecified multiple injuries, initial encounter: Secondary | ICD-10-CM | POA: Diagnosis not present

## 2023-07-18 DIAGNOSIS — Z66 Do not resuscitate: Secondary | ICD-10-CM | POA: Diagnosis not present

## 2023-07-18 DIAGNOSIS — Z515 Encounter for palliative care: Secondary | ICD-10-CM

## 2023-07-18 DIAGNOSIS — Z7189 Other specified counseling: Secondary | ICD-10-CM

## 2023-07-18 DIAGNOSIS — S2500XA Unspecified injury of thoracic aorta, initial encounter: Secondary | ICD-10-CM

## 2023-07-18 LAB — BASIC METABOLIC PANEL
Anion gap: 9 (ref 5–15)
BUN: 57 mg/dL — ABNORMAL HIGH (ref 8–23)
CO2: 22 mmol/L (ref 22–32)
Calcium: 6.5 mg/dL — ABNORMAL LOW (ref 8.9–10.3)
Chloride: 104 mmol/L (ref 98–111)
Creatinine, Ser: 2.89 mg/dL — ABNORMAL HIGH (ref 0.61–1.24)
GFR, Estimated: 23 mL/min — ABNORMAL LOW (ref >60)
Glucose, Bld: 220 mg/dL — ABNORMAL HIGH (ref 70–99)
Potassium: 5.9 mmol/L — ABNORMAL HIGH (ref 3.5–5.1)
Sodium: 135 mmol/L (ref 135–145)

## 2023-07-18 LAB — POCT I-STAT 7, (LYTES, BLD GAS, ICA,H+H)
Acid-base deficit: 8 mmol/L — ABNORMAL HIGH (ref 0.0–2.0)
Bicarbonate: 20.4 mmol/L (ref 20.0–28.0)
Calcium, Ion: 0.88 mmol/L — CL (ref 1.15–1.40)
HCT: 29 % — ABNORMAL LOW (ref 39.0–52.0)
Hemoglobin: 9.9 g/dL — ABNORMAL LOW (ref 13.0–17.0)
O2 Saturation: 87 %
Patient temperature: 99.7
Potassium: 5.7 mmol/L — ABNORMAL HIGH (ref 3.5–5.1)
Sodium: 137 mmol/L (ref 135–145)
TCO2: 22 mmol/L (ref 22–32)
pCO2 arterial: 53.6 mmHg — ABNORMAL HIGH (ref 32–48)
pH, Arterial: 7.191 — CL (ref 7.35–7.45)
pO2, Arterial: 68 mmHg — ABNORMAL LOW (ref 83–108)

## 2023-07-18 LAB — PHOSPHORUS: Phosphorus: 5.7 mg/dL — ABNORMAL HIGH (ref 2.5–4.6)

## 2023-07-18 LAB — GLUCOSE, CAPILLARY
Glucose-Capillary: 208 mg/dL — ABNORMAL HIGH (ref 70–99)
Glucose-Capillary: 216 mg/dL — ABNORMAL HIGH (ref 70–99)
Glucose-Capillary: 115 mg/dL — ABNORMAL HIGH (ref 70–99)

## 2023-07-18 LAB — MAGNESIUM: Magnesium: 2.3 mg/dL (ref 1.7–2.4)

## 2023-07-18 MED ORDER — GLYCOPYRROLATE 0.2 MG/ML IJ SOLN: 0.4000 mg | Freq: Once | INTRAMUSCULAR | Status: DC

## 2023-07-18 MED ORDER — DARBEPOETIN ALFA 100 MCG/0.5ML IJ SOSY
100.0000 ug | PREFILLED_SYRINGE | INTRAMUSCULAR | Status: DC
  Filled 2023-07-18: qty 0.5

## 2023-07-18 MED ORDER — PRISMASOL BGK 0/2.5 32-2.5 MEQ/L EC SOLN
Status: DC
  Filled 2023-07-18 (×2): qty 5000

## 2023-07-18 MED ORDER — HEPARIN SODIUM (PORCINE) 1000 UNIT/ML DIALYSIS: 1000.0000 [IU] | INTRAMUSCULAR | Status: DC | PRN

## 2023-07-18 MED ORDER — PIPERACILLIN-TAZOBACTAM 3.375 G IVPB 30 MIN
3.3750 g | Freq: Four times a day (QID) | INTRAVENOUS | Status: DC
  Filled 2023-07-18 (×2): qty 50

## 2023-07-18 MED ORDER — ALBUMIN HUMAN 5 % IV SOLN
25.0000 g | Freq: Once | INTRAVENOUS | Status: AC
  Administered 2023-07-18: 25 g via INTRAVENOUS
  Filled 2023-07-18: qty 500

## 2023-07-18 MED ORDER — IPRATROPIUM-ALBUTEROL 0.5-2.5 (3) MG/3ML IN SOLN: 3.0000 mL | RESPIRATORY_TRACT | Status: DC | PRN

## 2023-07-18 MED ORDER — HEPARIN SODIUM (PORCINE) 5000 UNIT/ML IJ SOLN
5000.0000 [IU] | Freq: Three times a day (TID) | INTRAMUSCULAR | Status: DC
  Administered 2023-07-18: 5000 [IU] via SUBCUTANEOUS
  Filled 2023-07-18: qty 1

## 2023-07-18 MED ORDER — SODIUM CHLORIDE 0.9 % IV SOLN
4.0000 g | Freq: Once | INTRAVENOUS | Status: AC
  Administered 2023-07-18: 4 g via INTRAVENOUS
  Filled 2023-07-18: qty 40

## 2023-07-18 MED ORDER — TRACE MINERALS CU-MN-SE-ZN 300-55-60-3000 MCG/ML IV SOLN: INTRAVENOUS | Status: DC

## 2023-07-18 MED ORDER — PROPOFOL 1000 MG/100ML IV EMUL
5.0000 ug/kg/min | INTRAVENOUS | Status: DC
  Filled 2023-07-18: qty 100

## 2023-07-18 MED ORDER — LORAZEPAM 2 MG/ML IJ SOLN
1.0000 mg | INTRAMUSCULAR | Status: DC | PRN
  Administered 2023-07-18: 2 mg via INTRAVENOUS
  Filled 2023-07-18: qty 1

## 2023-07-18 MED ORDER — GLYCOPYRROLATE 0.2 MG/ML IJ SOLN
0.4000 mg | Freq: Once | INTRAMUSCULAR | Status: AC
  Administered 2023-07-18: 0.4 mg via INTRAVENOUS
  Filled 2023-07-18: qty 2

## 2023-07-18 MED ORDER — MIDAZOLAM HCL 2 MG/2ML IJ SOLN: 1.0000 mg | INTRAMUSCULAR | Status: DC | PRN

## 2023-07-18 MED ORDER — FENTANYL BOLUS VIA INFUSION: 50.0000 ug | INTRAVENOUS | Status: DC | PRN

## 2023-07-18 MED ORDER — TRACE MINERALS CU-MN-SE-ZN 300-55-60-3000 MCG/ML IV SOLN
INTRAVENOUS | Status: DC
  Filled 2023-07-18: qty 900

## 2023-07-18 MED ORDER — SODIUM CHLORIDE 0.9 % FOR CRRT: INTRAVENOUS_CENTRAL | Status: DC | PRN

## 2023-07-18 MED ORDER — GLYCOPYRROLATE 0.2 MG/ML IJ SOLN: 0.2000 mg | INTRAMUSCULAR | Status: DC | PRN

## 2023-07-18 MED ORDER — FENTANYL CITRATE (PF) 2500 MCG/50ML IJ SOLN
0.0000 ug/h | Status: DC
  Filled 2023-07-18: qty 100

## 2023-07-18 MED ORDER — FOLIC ACID 5 MG/ML IJ SOLN
1.0000 mg | Freq: Every day | INTRAMUSCULAR | Status: DC
  Administered 2023-07-18: 1 mg via INTRAVENOUS
  Filled 2023-07-18: qty 0.2

## 2023-07-18 MED ORDER — NOREPINEPHRINE 16 MG/250ML-% IV SOLN
0.0000 ug/min | INTRAVENOUS | Status: DC
  Administered 2023-07-18: 20 ug/min via INTRAVENOUS
  Filled 2023-07-18: qty 250

## 2023-07-18 MED ORDER — PRISMASOL BGK 0/2.5 32-2.5 MEQ/L EC SOLN
Status: DC
  Filled 2023-07-18 (×7): qty 5000

## 2023-07-18 MED ORDER — FUROSEMIDE 10 MG/ML IJ SOLN
80.0000 mg | Freq: Once | INTRAMUSCULAR | Status: DC
Start: 2023-07-18 — End: 2023-07-18

## 2023-08-14 NOTE — Procedures (Signed)
Extubation Procedure Note  Patient Details:   Name: Jerry Daniels DOB: [birth year] MRN: [id number]   Airway Documentation:    Vent end date: 08/06/2023 Vent end time: 1741   Evaluation  O2 sats: stable throughout Complications: No apparent complications Patient did tolerate procedure well. Bilateral Breath Sounds: Rhonchi, Diminished   No, pt could not speak post extubation.  Pt extubated to room air per physician's order and in accordance with the family's wishes.  Family present at bedside for extubation.  Audrie Lia 07/22/2023, 5:42 PM

## 2023-08-14 NOTE — Death Summary Note (Signed)
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 BB was placed on the right temple in order to reliably differentiate right from left. Multidetector CT imaging of the cervical spine was performed without intravenous contrast. Multiplanar CT image reconstructions were also generated. Multidetector CT imaging of the chest, abdomen and pelvis was performed following the standard protocol during bolus administration of intravenous contrast. RADIATION DOSE REDUCTION: This exam was performed according to the departmental dose-optimization program which includes automated exposure control, adjustment of the mA and/or kV according to patient size and/or use of iterative reconstruction technique. CONTRAST:  75mL OMNIPAQUE IOHEXOL 350 MG/ML SOLN COMPARISON:  CT scan head, maxillofacial and cervical spine studies 08/25/2020. No other comparison studies. FINDINGS: CT HEAD FINDINGS Brain: There is mild cerebral atrophy, small-vessel disease atrophic ventricular prominence. Cerebellum and brainstem are unremarkable. No cortical based infarct, hemorrhage or mass are seen and no mass effect. There is no midline shift. The basal cisterns are clear. Vascular: There are patchy calcifications in the siphons. There are no hyperdense central vessels. Skull: Negative for fracture or focal lesions.  Plate fixation noted over the left frontal sinus, as before. Other: None.  No appreciable scalp hematoma. CT MAXILLOFACIAL FINDINGS Osseous: This study is significantly motion limited. There is no obvious displaced fracture but a subtle fracture would be missed due to the amount of motion degradation on the images. There is no mandibular dislocation. There are multifocal dental caries warranting follow-up with a dentist. There is a chronic fracture deformity of the nasal bone. Orbits: Old left orbital floor fracture mesh repair. A small amount of orbital fat is again noted as below the mesh as well as a bone fragment. No acute orbital fracture is seen. There is mild chronic depressed fracture deformity of the left lamina papyracea. The bilateral orbital contents are unremarkable. Sinuses: There is a chronic depressed fracture of the anterior wall the left maxillary sinus with overlying fracture fixation bloating, which extends to the nasal bone. There is mild-to-moderate membrane thickening in the maxillary sinuses and mild membrane disease in the frontal, ethmoid and sphenoid sinuses. There are no sinus fluid levels or mastoid effusions. There is a left external auditory canal wax impaction. Nasal septum is S shaped. The nasal passages are unobstructed. Soft tissues: No focal soft tissue swelling. Motion artifact obscures fine detail. There are stones in the palatine tonsils. CT CERVICAL SPINE FINDINGS Alignment: Normal. Skull base and vertebrae: Study is motion limited. No spinal compression fracture is seen. No displaced fractures evident but a subtle fracture would be missed due to motion degradation. Soft tissues and spinal canal: No prevertebral fluid or swelling. No visible canal hematoma. There calcifications at the carotid bifurcations on the left-greater-than-right. No thyroid mass. Disc levels: The cervical discs are again noted degenerated, greatest disc space loss C3-4 through C6-7. There are  bidirectional osteophytes. 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 As a result, relatively mild posterior disc osteophyte complexes at C4-5, C5-6 and C6-7 are noted causing spinal canal stenosis and mild spondylotic cord compression. There is multilevel significant degenerative foraminal stenosis due to uncinate joint and facet hypertrophy, also exacerbated by the short pedicles. Similar findings were noted previously. Other: None. CT CHEST FINDINGS Cardiovascular: There is mild cardiomegaly with three-vessel coronary artery calcifications. Pulmonary arteries and veins are normal caliber. The pulmonary arteries are centrally clear. There is no pericardial effusion. There is a  cuff of hypodense material surrounding the aortic lumen beginning in the proximal aortic arch continuing down to the hiatal segment, but not continuing below the diaphragm. This is consistent with a type B intramural hematoma with similar extension of this to surround the proximal most great vessels. No mediastinal hematoma is seen. There is no intraluminal dissection. There is mild scattered aortic calcific plaque. There is no aortic aneurysm. 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 This measures 1.5 cm craniocaudal, 7 mm across the base and 4 mm depth. Mediastinum/Nodes: No enlarged mediastinal, hilar, or axillary lymph nodes. Thyroid gland, trachea, and esophagus demonstrate no significant findings. Lungs/Pleura: There is respiratory motion on exam. There are trace pleural effusions. Pneumothorax. Low inspiration on exam with moderately elevated right diaphragm. There are posterior opacities in the upper and lower lobes, in large part if not all probably due to dependent atelectasis, component of pulmonary contusions is not excluded due to the  presence of rib fractures. There is diffuse bronchial thickening. This is greater in the lower lobes. There is no confluent consolidation and no nodules are seen through the breathing motion. Musculoskeletal: There are slightly displaced fractures of the left anterolateral third through seventh ribs, the left posteromedial sixth and seventh ribs and left posterior eleventh rib. On the right, there are slightly displaced fractures of the anterolateral fourth through eighth ribs and the posteromedial eleventh and twelfth ribs. No spinal fracture seen. The sternum is intact. The visualized shoulder girdles are intact. In addition, there is evidence of at least a partial tear of the right crus of the diaphragm which is swollen and with patchy increased opacity within the muscle consistent with intramuscular hemorrhage. There is trace retrocrural hemorrhage as well. CT ABDOMEN AND PELVIS FINDINGS Hepatobiliary: No hepatic injury or perihepatic hematoma. There is a small volume of hemorrhage tracking inferior to the liver, probably originating from the diaphragmatic tear. Gallbladder is unremarkable. There is no biliary dilatation, no liver mass. Mild hepatic steatosis. Pancreas: No abnormality. Spleen: No splenic laceration is seen. There is a small volume of retroperitoneal hemorrhage extending inferior to the spleen probably tracked over from the diaphragmatic tear. Adrenals/Urinary Tract: No adrenal or perirenal hemorrhage, mass enhancement, stones or hydronephrosis. Normal bladder. Stomach/Bowel: No dilatation or wall thickening. Uncomplicated left-sided diverticula. An appendix is not seen. Vascular/Lymphatic: There is abdominal aortic atherosclerosis. No AAA. No abdominal aortic hematoma. The portal vein opacifies normally. Reproductive: No prostatomegaly. Other: As above, a small amount hemorrhage tracks inferior to both liver and spleen, probably originating from the tear in the right diaphragmatic crus.  Additional small hemoperitoneum medial to the right pararenal space and overlying the right psoas muscle. No pelvic hematoma is seen. Small amount of additional hemorrhage overlying the left psoas. There is no free air. Musculoskeletal: 3.4 cm nonexpansile sclerotic lesion in the posterior left ilium. Additional small sclerotic lesions in the right ilium. There is a 1.3 cm sclerotic lesion in the intertrochanteric proximal left femur as well. Consider bone scintigraphy for follow-up when clinically feasible. A benign process is favored but metastases are not excluded at this age. There are slightly displaced transverse process fractures on the right at L1 and L2 but no spinal compression fracture. There are degenerative changes of the lumbar spine greatest at L4-5 where there is disc collapse and grade 1 spondylolisthesis with acquired spinal stenosis. IMPRESSION: 1. No acute intracranial CT findings or depressed skull fractures. Chronic changes.  2. Significantly motion limited maxillofacial CT without obvious displaced fractures. 3. Chronic fracture deformities of the nasal bone, left orbital floor and anterior wall of the left maxillary sinus with surgical repairs. 4. Multifocal dental caries warranting follow-up with a dentist. 5. No cervical spine fracture or listhesis.  Degenerative changes 6. 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 No intraluminal dissection or mediastinal hematoma. This extends to surround the proximal great vessels as well. Only other relevant differential would be aortitis/arteritis but this is considered much less likely. 7. Trace pleural effusions.  No pneumothorax 8. Dependent opacities in the upper and lower lobes, probably due to dependent atelectasis with component of pulmonary contusions not excluded. 9. Bronchitis. 10. Cardiomegaly with CAD and aortic and great vessel  atherosclerosis. 11. Multiple bilateral rib fractures. 12. Right L1 and L2 transverse process fractures. No spinal compression fracture. 13. At least a partial tear is noted in the right diaphragmatic crus with intramuscular hemorrhage and muscular thickening, small retroperitoneal hemorrhages extending inferiorly overlying the psoas muscles, and a small amount of hemorrhagic material inferior to both the liver and spleen. No pelvic hematoma. 14. Critical Value/emergent results were called by telephone at the time of interpretation on 07/28/2023 at 3:14 am to provider [facility] , who verbally acknowledged these results. Electronically Signed   By: Almira Bar M.D.   On: 28-Jul-2023 03:43   CT CERVICAL SPINE WO CONTRAST  Result Date: 2023-07-28 CLINICAL DATA:  Blunt polytrauma with the mechanism not specified. EXAM: CT HEAD WITHOUT CONTRAST CT MAXILLOFACIAL WITHOUT CONTRAST CT CERVICAL SPINE WITHOUT CONTRAST CT CHEST, ABDOMEN AND PELVIS WITH CONTRAST TECHNIQUE: Contiguous axial images were obtained from the base of the skull through the vertex without intravenous contrast. Multidetector CT imaging of the maxillofacial structures was performed. Multiplanar CT image reconstructions were also generated. A small metallic BB was placed on the right temple in order to reliably differentiate right from left. Multidetector CT imaging of the cervical spine was performed without intravenous contrast. Multiplanar CT image reconstructions were also generated. Multidetector CT imaging of the chest, abdomen and pelvis was performed following the standard protocol during bolus administration of intravenous contrast. RADIATION DOSE REDUCTION: This exam was performed according to the departmental dose-optimization program which includes automated exposure control, adjustment of the mA and/or kV according to patient size and/or use of iterative reconstruction technique. CONTRAST:  75mL OMNIPAQUE IOHEXOL 350 MG/ML SOLN  COMPARISON:  CT scan head, maxillofacial and cervical spine studies 08/25/2020. No other comparison studies. FINDINGS: CT HEAD FINDINGS Brain: There is mild cerebral atrophy, small-vessel disease atrophic ventricular prominence. Cerebellum and brainstem are unremarkable. No cortical based infarct, hemorrhage or mass are seen and no mass effect. There is no midline shift. The basal cisterns are clear. Vascular: There are patchy calcifications in the siphons. There are no hyperdense central vessels. Skull: Negative for fracture or focal lesions. Plate fixation noted over the left frontal sinus, as before. Other: None.  No appreciable scalp hematoma. CT MAXILLOFACIAL FINDINGS Osseous: This study is significantly motion limited. There is no obvious displaced fracture but a subtle fracture would be missed due to the amount of motion degradation on the images. There is no mandibular dislocation. There are multifocal dental caries warranting follow-up with a dentist. There is a chronic fracture deformity of the nasal bone. Orbits: Old left orbital floor fracture mesh repair. A small amount of orbital fat is again noted as below the mesh  as well as a bone fragment. No acute orbital fracture is seen. There is mild chronic depressed fracture deformity of the left lamina papyracea. The bilateral orbital contents are unremarkable. Sinuses: There is a chronic depressed fracture of the anterior wall the left maxillary sinus with overlying fracture fixation bloating, which extends to the nasal bone. There is mild-to-moderate membrane thickening in the maxillary sinuses and mild membrane disease in the frontal, ethmoid and sphenoid sinuses. There are no sinus fluid levels or mastoid effusions. There is a left external auditory canal wax impaction. Nasal septum is S shaped. The nasal passages are unobstructed. Soft tissues: No focal soft tissue swelling. Motion artifact obscures fine detail. There are stones in the palatine tonsils.  CT CERVICAL SPINE FINDINGS Alignment: Normal. Skull base and vertebrae: Study is motion limited. No spinal compression fracture is seen. No displaced fractures evident but a subtle fracture would be missed due to motion degradation. Soft tissues and spinal canal: No prevertebral fluid or swelling. No visible canal hematoma. There calcifications at the carotid bifurcations on the left-greater-than-right. No thyroid mass. Disc levels: The cervical discs are again noted degenerated, greatest disc space loss C3-4 through C6-7. There are bidirectional osteophytes. 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 As a result, relatively mild posterior disc osteophyte complexes at C4-5, C5-6 and C6-7 are noted causing spinal canal stenosis and mild spondylotic cord compression. There is multilevel significant degenerative foraminal stenosis due to uncinate joint and facet hypertrophy, also exacerbated by the short pedicles. Similar findings were noted previously. Other: None. CT CHEST FINDINGS Cardiovascular: There is mild cardiomegaly with three-vessel coronary artery calcifications. Pulmonary arteries and veins are normal caliber. The pulmonary arteries are centrally clear. There is no pericardial effusion. There is a cuff of hypodense material surrounding the aortic lumen beginning in the proximal aortic arch continuing down to the hiatal segment, but not continuing below the diaphragm. This is consistent with a type B intramural hematoma with similar extension of this to surround the proximal most great vessels. No mediastinal hematoma is seen. There is no intraluminal dissection. There is mild scattered aortic calcific plaque. There is no aortic aneurysm. 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 This measures 1.5 cm craniocaudal,  7 mm across the base and 4 mm depth. Mediastinum/Nodes: No enlarged mediastinal, hilar, or axillary lymph nodes. Thyroid gland, trachea, and esophagus demonstrate no significant findings. Lungs/Pleura: There is respiratory motion on exam. There are trace pleural effusions. Pneumothorax. Low inspiration on exam with moderately elevated right diaphragm. There are posterior opacities in the upper and lower lobes, in large part if not all probably due to dependent atelectasis, component of pulmonary contusions is not excluded due to the presence of rib fractures. There is diffuse bronchial thickening. This is greater in the lower lobes. There is no confluent consolidation and no nodules are seen through the breathing motion. Musculoskeletal: There are slightly displaced fractures of the left anterolateral third through seventh ribs, the left posteromedial sixth and seventh ribs and left posterior eleventh rib. On the right, there are slightly displaced fractures of the anterolateral fourth through eighth ribs and the posteromedial eleventh and twelfth ribs. No spinal fracture seen. The sternum is intact. The visualized shoulder girdles are intact. In addition, there is evidence of at least a partial tear of the right crus of the  diaphragm which is swollen and with patchy increased opacity within the muscle consistent with intramuscular hemorrhage. There is trace retrocrural hemorrhage as well. CT ABDOMEN AND PELVIS FINDINGS Hepatobiliary: No hepatic injury or perihepatic hematoma. There is a small volume of hemorrhage tracking inferior to the liver, probably originating from the diaphragmatic tear. Gallbladder is unremarkable. There is no biliary dilatation, no liver mass. Mild hepatic steatosis. Pancreas: No abnormality. Spleen: No splenic laceration is seen. There is a small volume of retroperitoneal hemorrhage extending inferior to the spleen probably tracked over from the diaphragmatic tear. Adrenals/Urinary Tract:  No adrenal or perirenal hemorrhage, mass enhancement, stones or hydronephrosis. Normal bladder. Stomach/Bowel: No dilatation or wall thickening. Uncomplicated left-sided diverticula. An appendix is not seen. Vascular/Lymphatic: There is abdominal aortic atherosclerosis. No AAA. No abdominal aortic hematoma. The portal vein opacifies normally. Reproductive: No prostatomegaly. Other: As above, a small amount hemorrhage tracks inferior to both liver and spleen, probably originating from the tear in the right diaphragmatic crus. Additional small hemoperitoneum medial to the right pararenal space and overlying the right psoas muscle. No pelvic hematoma is seen. Small amount of additional hemorrhage overlying the left psoas. There is no free air. Musculoskeletal: 3.4 cm nonexpansile sclerotic lesion in the posterior left ilium. Additional small sclerotic lesions in the right ilium. There is a 1.3 cm sclerotic lesion in the intertrochanteric proximal left femur as well. Consider bone scintigraphy for follow-up when clinically feasible. A benign process is favored but metastases are not excluded at this age. There are slightly displaced transverse process fractures on the right at L1 and L2 but no spinal compression fracture. There are degenerative changes of the lumbar spine greatest at L4-5 where there is disc collapse and grade 1 spondylolisthesis with acquired spinal stenosis. IMPRESSION: 1. No acute intracranial CT findings or depressed skull fractures. Chronic changes. 2. Significantly motion limited maxillofacial CT without obvious displaced fractures. 3. Chronic fracture deformities of the nasal bone, left orbital floor and anterior wall of the left maxillary sinus with surgical repairs. 4. Multifocal dental caries warranting follow-up with a dentist. 5. No cervical spine fracture or listhesis.  Degenerative changes 6. 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 No intraluminal dissection or mediastinal hematoma. This extends to surround the proximal great vessels as well. Only other relevant differential would be aortitis/arteritis but this is considered much less likely. 7. Trace pleural effusions.  No pneumothorax 8. Dependent opacities in the upper and lower lobes, probably due to dependent atelectasis with component of pulmonary contusions not excluded. 9. Bronchitis. 10. Cardiomegaly with CAD and aortic and great vessel atherosclerosis. 11. Multiple bilateral rib fractures. 12. Right L1 and L2 transverse process fractures. No spinal compression fracture. 13. At least a partial tear is noted in the right diaphragmatic crus with intramuscular hemorrhage and muscular thickening, small retroperitoneal hemorrhages extending inferiorly overlying the psoas muscles, and a small amount of hemorrhagic material inferior to both the liver and spleen. No pelvic hematoma. 14. Critical Value/emergent results were called by telephone at the time of interpretation on 07-16-23 at 3:14 am to provider [facility] , who verbally acknowledged these results. Electronically Signed   By: Almira Bar M.D.   On: 07/16/23 03:43   CT CHEST ABDOMEN PELVIS W CONTRAST  Result Date: 2023/07/16 CLINICAL DATA:  Blunt polytrauma with the mechanism not specified. EXAM: CT HEAD WITHOUT CONTRAST CT MAXILLOFACIAL WITHOUT CONTRAST CT CERVICAL SPINE WITHOUT CONTRAST CT CHEST,  ABDOMEN AND PELVIS WITH CONTRAST TECHNIQUE: Contiguous axial images were obtained from the base of the skull through the vertex without intravenous contrast. Multidetector CT imaging of the maxillofacial structures was performed. Multiplanar CT image reconstructions were also generated. A small metallic BB was placed on the right temple in order to reliably differentiate right from left. Multidetector CT imaging of the cervical spine was performed without  intravenous contrast. Multiplanar CT image reconstructions were also generated. Multidetector CT imaging of the chest, abdomen and pelvis was performed following the standard protocol during bolus administration of intravenous contrast. RADIATION DOSE REDUCTION: This exam was performed according to the departmental dose-optimization program which includes automated exposure control, adjustment of the mA and/or kV according to patient size and/or use of iterative reconstruction technique. CONTRAST:  75mL OMNIPAQUE IOHEXOL 350 MG/ML SOLN COMPARISON:  CT scan head, maxillofacial and cervical spine studies 08/25/2020. No other comparison studies. FINDINGS: CT HEAD FINDINGS Brain: There is mild cerebral atrophy, small-vessel disease atrophic ventricular prominence. Cerebellum and brainstem are unremarkable. No cortical based infarct, hemorrhage or mass are seen and no mass effect. There is no midline shift. The basal cisterns are clear. Vascular: There are patchy calcifications in the siphons. There are no hyperdense central vessels. Skull: Negative for fracture or focal lesions. Plate fixation noted over the left frontal sinus, as before. Other: None.  No appreciable scalp hematoma. CT MAXILLOFACIAL FINDINGS Osseous: This study is significantly motion limited. There is no obvious displaced fracture but a subtle fracture would be missed due to the amount of motion degradation on the images. There is no mandibular dislocation. There are multifocal dental caries warranting follow-up with a dentist. There is a chronic fracture deformity of the nasal bone. Orbits: Old left orbital floor fracture mesh repair. A small amount of orbital fat is again noted as below the mesh as well as a bone fragment. No acute orbital fracture is seen. There is mild chronic depressed fracture deformity of the left lamina papyracea. The bilateral orbital contents are unremarkable. Sinuses: There is a chronic depressed fracture of the anterior  wall the left maxillary sinus with overlying fracture fixation bloating, which extends to the nasal bone. There is mild-to-moderate membrane thickening in the maxillary sinuses and mild membrane disease in the frontal, ethmoid and sphenoid sinuses. There are no sinus fluid levels or mastoid effusions. There is a left external auditory canal wax impaction. Nasal septum is S shaped. The nasal passages are unobstructed. Soft tissues: No focal soft tissue swelling. Motion artifact obscures fine detail. There are stones in the palatine tonsils. CT CERVICAL SPINE FINDINGS Alignment: Normal. Skull base and vertebrae: Study is motion limited. No spinal compression fracture is seen. No displaced fractures evident but a subtle fracture would be missed due to motion degradation. Soft tissues and spinal canal: No prevertebral fluid or swelling. No visible canal hematoma. There calcifications at the carotid bifurcations on the left-greater-than-right. No thyroid mass. Disc levels: The cervical discs are again noted degenerated, greatest disc space loss C3-4 through C6-7. There are bidirectional osteophytes. 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 As a result, relatively mild posterior disc osteophyte complexes at C4-5, C5-6 and C6-7 are noted causing spinal canal stenosis and mild spondylotic cord compression. There is multilevel significant degenerative foraminal stenosis due to uncinate joint and facet hypertrophy, also exacerbated by the short pedicles. Similar findings were noted previously. Other: None. CT CHEST FINDINGS Cardiovascular: There  is mild cardiomegaly with three-vessel coronary artery calcifications. Pulmonary arteries and veins are normal caliber. The pulmonary arteries are centrally clear. There is no pericardial effusion. There is a cuff of hypodense material surrounding the aortic lumen beginning in the proximal aortic  arch continuing down to the hiatal segment, but not continuing below the diaphragm. This is consistent with a type B intramural hematoma with similar extension of this to surround the proximal most great vessels. No mediastinal hematoma is seen. There is no intraluminal dissection. There is mild scattered aortic calcific plaque. There is no aortic aneurysm. 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 This measures 1.5 cm craniocaudal, 7 mm across the base and 4 mm depth. Mediastinum/Nodes: No enlarged mediastinal, hilar, or axillary lymph nodes. Thyroid gland, trachea, and esophagus demonstrate no significant findings. Lungs/Pleura: There is respiratory motion on exam. There are trace pleural effusions. Pneumothorax. Low inspiration on exam with moderately elevated right diaphragm. There are posterior opacities in the upper and lower lobes, in large part if not all probably due to dependent atelectasis, component of pulmonary contusions is not excluded due to the presence of rib fractures. There is diffuse bronchial thickening. This is greater in the lower lobes. There is no confluent consolidation and no nodules are seen through the breathing motion. Musculoskeletal: There are slightly displaced fractures of the left anterolateral third through seventh ribs, the left posteromedial sixth and seventh ribs and left posterior eleventh rib. On the right, there are slightly displaced fractures of the anterolateral fourth through eighth ribs and the posteromedial eleventh and twelfth ribs. No spinal fracture seen. The sternum is intact. The visualized shoulder girdles are intact. In addition, there is evidence of at least a partial tear of the right crus of the diaphragm which is swollen and with patchy increased opacity within the muscle consistent with intramuscular hemorrhage. There is trace retrocrural hemorrhage  as well. CT ABDOMEN AND PELVIS FINDINGS Hepatobiliary: No hepatic injury or perihepatic hematoma. There is a small volume of hemorrhage tracking inferior to the liver, probably originating from the diaphragmatic tear. Gallbladder is unremarkable. There is no biliary dilatation, no liver mass. Mild hepatic steatosis. Pancreas: No abnormality. Spleen: No splenic laceration is seen. There is a small volume of retroperitoneal hemorrhage extending inferior to the spleen probably tracked over from the diaphragmatic tear. Adrenals/Urinary Tract: No adrenal or perirenal hemorrhage, mass enhancement, stones or hydronephrosis. Normal bladder. Stomach/Bowel: No dilatation or wall thickening. Uncomplicated left-sided diverticula. An appendix is not seen. Vascular/Lymphatic: There is abdominal aortic atherosclerosis. No AAA. No abdominal aortic hematoma. The portal vein opacifies normally. Reproductive: No prostatomegaly. Other: As above, a small amount hemorrhage tracks inferior to both liver and spleen, probably originating from the tear in the right diaphragmatic crus. Additional small hemoperitoneum medial to the right pararenal space and overlying the right psoas muscle. No pelvic hematoma is seen. Small amount of additional hemorrhage overlying the left psoas. There is no free air. Musculoskeletal: 3.4 cm nonexpansile sclerotic lesion in the posterior left ilium. Additional small sclerotic lesions in the right ilium. There is a 1.3 cm sclerotic lesion in the intertrochanteric proximal left femur as well. Consider bone scintigraphy for follow-up when clinically feasible. A benign process is favored but metastases are not excluded at this age. There are slightly displaced transverse process fractures on the right at L1 and L2 but no spinal compression fracture. There are degenerative changes of the lumbar spine  greatest at L4-5 where there is disc collapse and grade 1 spondylolisthesis with acquired spinal stenosis.  IMPRESSION: 1. No acute intracranial CT findings or depressed skull fractures. Chronic changes. 2. Significantly motion limited maxillofacial CT without obvious displaced fractures. 3. Chronic fracture deformities of the nasal bone, left orbital floor and anterior wall of the left maxillary sinus with surgical repairs. 4. Multifocal dental caries warranting follow-up with a dentist. 5. No cervical spine fracture or listhesis.  Degenerative changes 6. 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 No intraluminal dissection or mediastinal hematoma. This extends to surround the proximal great vessels as well. Only other relevant differential would be aortitis/arteritis but this is considered much less likely. 7. Trace pleural effusions.  No pneumothorax 8. Dependent opacities in the upper and lower lobes, probably due to dependent atelectasis with component of pulmonary contusions not excluded. 9. Bronchitis. 10. Cardiomegaly with CAD and aortic and great vessel atherosclerosis. 11. Multiple bilateral rib fractures. 12. Right L1 and L2 transverse process fractures. No spinal compression fracture. 13. At least a partial tear is noted in the right diaphragmatic crus with intramuscular hemorrhage and muscular thickening, small retroperitoneal hemorrhages extending inferiorly overlying the psoas muscles, and a small amount of hemorrhagic material inferior to both the liver and spleen. No pelvic hematoma. 14. Critical Value/emergent results were called by telephone at the time of interpretation on 07/08/2023 at 3:14 am to provider [facility] , who verbally acknowledged these results. Electronically Signed   By: Almira Bar M.D.   On: 06/27/2023 03:43   DG Chest Port 1 View  Result Date: 07/03/2023 CLINICAL DATA:  Status post trauma. EXAM: PORTABLE CHEST 1 VIEW COMPARISON:  None Available. FINDINGS: The  cardiac silhouette is mildly enlarged. Low lung volumes are seen with subsequent crowding of the bronchovascular lung markings. Mild bilateral infrahilar atelectatic changes are seen. No pleural effusion or pneumothorax is identified. No acute osseous abnormalities are identified. IMPRESSION: Low lung volumes with mild bilateral infrahilar atelectasis. Electronically Signed   By: Aram Candela M.D.   On: 07/01/2023 02:41   CT T-SPINE NO CHARGE  Result Date: 06/23/2023 CLINICAL DATA:  Trauma EXAM: CT Thoracic and Lumbar spine without contrast TECHNIQUE: Multiplanar CT images of the thoracic and lumbar spine were reconstructed from contemporary CT of the Chest, Abdomen, and Pelvis. RADIATION DOSE REDUCTION: This exam was performed according to the departmental dose-optimization program which includes automated exposure control, adjustment of the mA and/or kV according to patient size and/or use of iterative reconstruction technique. CONTRAST:  No additional contrast COMPARISON:  None Available. FINDINGS: CT THORACIC SPINE FINDINGS Alignment: Normal. Vertebrae: There is widening of the T11-12 disc space with a fracture of the anterior inferior corner of T11. Disc levels: No spinal canal stenosis CT LUMBAR SPINE FINDINGS Segmentation: 5 lumbar type vertebrae. Alignment: Grade 1 anterolisthesis at L4-5 Vertebrae: Right transverse process fractures at L1, L2 and L4 Disc levels: Multilevel facet arthrosis. Severe bilateral L4 foraminal stenosis. IMPRESSION: 1. Widening of the T11-12 disc space with a fracture of the anterior inferior corner of T11. This is concerning for ligamentous injury and MRI of the thoracic spine is recommended when the patient is stable. 2. Right transverse process fractures at L1, L2 and L4. 3. Please see report for CT chest, abdomen and pelvis for discussion of other acute findings. Electronically Signed   By: Deatra Robinson M.D.   On: 07/07/2023 02:39   CT  L-SPINE NO CHARGE  Result  Date: 06/29/2023 CLINICAL DATA:  Trauma EXAM: CT Thoracic and Lumbar spine without contrast TECHNIQUE: Multiplanar CT images of the thoracic and lumbar spine were reconstructed from contemporary CT of the Chest, Abdomen, and Pelvis. RADIATION DOSE REDUCTION: This exam was performed according to the departmental dose-optimization program which includes automated exposure control, adjustment of the mA and/or kV according to patient size and/or use of iterative reconstruction technique. CONTRAST:  No additional contrast COMPARISON:  None Available. FINDINGS: CT THORACIC SPINE FINDINGS Alignment: Normal. Vertebrae: There is widening of the T11-12 disc space with a fracture of the anterior inferior corner of T11. Disc levels: No spinal canal stenosis CT LUMBAR SPINE FINDINGS Segmentation: 5 lumbar type vertebrae. Alignment: Grade 1 anterolisthesis at L4-5 Vertebrae: Right transverse process fractures at L1, L2 and L4 Disc levels: Multilevel facet arthrosis. Severe bilateral L4 foraminal stenosis. IMPRESSION: 1. Widening of the T11-12 disc space with a fracture of the anterior inferior corner of T11. This is concerning for ligamentous injury and MRI of the thoracic spine is recommended when the patient is stable. 2. Right transverse process fractures at L1, L2 and L4. 3. Please see report for CT chest, abdomen and pelvis for discussion of other acute findings. Electronically Signed   By: Deatra Robinson M.D.   On: 07/08/2023 02:39   DG Pelvis Portable  Result Date: 06/29/2023 CLINICAL DATA:  Status post trauma. EXAM: PORTABLE PELVIS 1-2 VIEWS COMPARISON:  None Available. FINDINGS: There is no evidence of acute pelvic fracture or diastasis. A 1.7 cm x 1.9 cm sclerotic focus is seen overlying the inter trochanteric region of the proximal left femur. IMPRESSION: 1. No acute pelvic fracture. 2. Sclerotic focus within the proximal left femur, likely representing a benign bone island. Electronically Signed   By: Aram Candela M.D.   On: 06/19/2023 02:01    Microbiology Recent Results (from the past 240 hour(s))  Culture, Respiratory w Gram Stain     Status: None   Collection Time: 07/12/23  4:01 PM   Specimen: Tracheal Aspirate; Respiratory  Result Value Ref Range Status   Specimen Description TRACHEAL ASPIRATE  Final   Special Requests NONE  Final   Gram Stain   Final    NO WBC SEEN ABUNDANT GRAM POSITIVE COCCI RARE GRAM POSITIVE RODS RARE GRAM NEGATIVE COCCOBACILLI ABUNDANT GRAM NEGATIVE RODS    Culture   Final    ABUNDANT Normal respiratory flora-no Staph aureus or Pseudomonas seen Performed at [facility], [address], [place], [place] [address]    Report Status 07/14/2023 FINAL  Final    Lab Basic Metabolic Panel: Recent Labs  Lab 07/19/2023 1710 08/04/2023 1756 07/16/23 9563 July 25, 2023 0616 25-Jul-2023 0807 07/29/2023 0423 07/16/2023 0754 07/17/2023 0838  NA 142   < > 141 145 147* 137 135 137  K 4.2   < > 3.5 4.0 4.0 5.8* 5.9* 5.7*  CL 109  --  108 109  --  106 104  --   CO2 23  --  25 25  --  23 22  --   GLUCOSE 180*  --  197* 173*  --  222* 220*  --   BUN 40*  --  37* 39*  --  54* 57*  --   CREATININE 1.63*  --  1.39* 1.27*  --  2.45* 2.89*  --   CALCIUM 7.9*  --  7.5* 7.7*  --  6.6* 6.5*  --   MG  --   --  3.4* 3.1*  --  2.3 2.3  --   PHOS  --   --  2.7 1.6*  --  5.3* 5.7*  --    < > = values in this interval not displayed.   Liver Function Tests: Recent Labs  Lab 07/16/23 0614 07/20/2023 0616 2023/07/23 0423  AST 81* 49* 69*  ALT 67* 52* 37  ALKPHOS 78 68 51  BILITOT 1.0 0.8 2.3*  PROT 5.3* 5.3* 4.8*  ALBUMIN 1.8* 1.7* 1.9*   No results for input(s): "LIPASE", "AMYLASE" in the last 168 hours. Recent Labs  Lab 07/16/23 0614 08/13/2023 0616 07/23/23 0423  AMMONIA 40* 41* 53*   CBC: Recent Labs  Lab 07/16/23 0614 07/26/2023 0616 07/31/2023 0807 08/13/2023 1124 07/26/2023 2108 07-23-23 0423 2023/07/23 0838  WBC 12.8* 12.4*  --  7.1 38.0* 41.9*  --   HGB 10.3* 10.2*  9.2* 11.1* 10.9* 10.1* 9.9*  HCT 30.9* 31.6* 27.0* 35.5* 34.8* 31.8* 29.0*  MCV 101.3* 102.6*  --  106.3* 107.4* 108.5*  --   PLT 258 292  --  366 485* 421*  --    Cardiac Enzymes: No results for input(s): "CKTOTAL", "CKMB", "CKMBINDEX", "TROPONINI" in the last 168 hours. Sepsis Labs: Recent Labs  Lab 07/25/2023 0616 07/27/2023 1124 07/20/2023 2108 July 23, 2023 0423  WBC 12.4* 7.1 38.0* 41.9*    Procedures/Operations  Exlap, R hemicolectomy, TEVAR   Avangelina Flight N Velva Molinari 07/22/2023, 10:09 AM

## 2023-08-14 NOTE — Progress Notes (Signed)
Notified by nursing staff TNA discontinued. Tomasita Morrow, RN VAST

## 2023-08-14 NOTE — Progress Notes (Signed)
PHARMACY NOTE:  ANTIMICROBIAL RENAL DOSAGE ADJUSTMENT  Current antimicrobial regimen includes a mismatch between antimicrobial dosage and estimated renal function.  As per policy approved by the Pharmacy & Therapeutics and Medical Executive Committees, the antimicrobial dosage will be adjusted accordingly.  Current antimicrobial dosage:  Zosyn 3.375g IV q8h (4h infusion)  Indication: intra-abdominal infection  Renal Function: [x]      On CRRT    Antimicrobial dosage has been changed to:  Zosyn 3.375g IV q6h ( infusion) for CRRT   Leia Alf, PharmD, BCPS Please check AMION for all [facility] Pharmacy contact numbers Clinical Pharmacist 08/05/2023 11:06 AM
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
## 2023-08-14 NOTE — Anesthesia Postprocedure Evaluation (Signed)
Anesthesia Post Note  Patient: Jerry Daniels  Procedure(s) Performed: THORACIC AORTIC ENDOVASCULAR STENT GRAFT INTRAVASCULAR ULTRASOUND, distal thoracic aorta to abdominal aorta (Abdomen) EXPLORATION LAPAROTOMY (Abdomen) APPLICATION OF ABDOMINAL WOUND VAC (Bilateral: Abdomen)     Patient location during evaluation: SICU Anesthesia Type: General Level of consciousness: sedated Pain management: pain level controlled Vital Signs Assessment: post-procedure vital signs reviewed and stable Respiratory status: patient remains intubated per anesthesia plan Cardiovascular status: stable Postop Assessment: no apparent nausea or vomiting Anesthetic complications: no   No notable events documented.  Last Vitals:  Vitals:   07/25/2023 0750 08/06/2023 0800  BP:  (!) 154/54  Pulse:  83  Resp:  (!) 24  Temp:  37.4 C  SpO2: 98% 98%    Last Pain:  Vitals:   08/11/2023 0700  TempSrc: Esophageal  PainSc:                  Saya Mccoll
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
## 2023-08-14 NOTE — Progress Notes (Addendum)
  Progress Note  VASCULAR SURGERY ASSESSMENT & PLAN:   POD 3 S/P ENDOVASCULAR REPAIR OF THORACIC AORTIC INJURY: He has good Doppler signals in both feet.  There is no hematoma in either groin.  A short 10 cm graft was placed above all of the mesenteric vessels.  This by itself would not put him at risk for bowel ischemia.  Cari Caraway, MD 9:27 AM   08/03/2023 7:35 AM 1 Day Post-Op  Subjective:  Intubated and sedated. Daughters at bedside   Vitals:   08/07/2023 0500 08/09/2023 0600  BP: (!) 127/52 (!) 131/51  Pulse: 79 80  Resp: (!) 24 (!) 24  Temp: 100 F (37.8 C) 99.9 F (37.7 C)  SpO2: 99% 98%   Physical Exam: Critically ill Intubated and sedated Open abdomen with VAC in place B 2+ femoral pulses, soft without swelling or hematoma 2+ radial pulses Doppler DP/PT signals in both feet. Warm and well perfused  CBC    Component Value Date/Time   WBC 41.9 (H) 07/24/2023 0423   RBC 2.93 (L) 08/11/2023 0423   HGB 10.1 (L) 07/24/2023 0423   HCT 31.8 (L) 07/27/2023 0423   PLT 421 (H) 07/23/2023 0423   MCV 108.5 (H) 07/22/2023 0423   MCH 34.5 (H) 07/24/2023 0423   MCHC 31.8 08/04/2023 0423   RDW 15.3 07/14/2023 0423   LYMPHSABS 1.8 01/18/2008 1635   MONOABS 0.5 01/18/2008 1635   EOSABS 0.3 01/18/2008 1635   BASOSABS 0.0 01/18/2008 1635    BMET    Component Value Date/Time   NA 137 08/09/2023 0423   K 5.8 (H) 07/25/2023 0423   CL 106 07/25/2023 0423   CO2 23 08/11/2023 0423   GLUCOSE 222 (H) 07/31/2023 0423   BUN 54 (H) 08/03/2023 0423   CREATININE 2.45 (H) 07/19/2023 0423   CALCIUM 6.6 (L) 08/04/2023 0423   GFRNONAA 28 (L) 07/21/2023 0423   GFRAA  01/18/2008 1635    >60        The eGFR has been calculated using the MDRD equation. This calculation has not been validated in all clinical    INR    Component Value Date/Time   INR 1.2 07/20/2023 0546     Intake/Output Summary (Last 24 hours) at 07/31/2023 0735 Last data filed at 07/31/2023 0600 Gross per 24  hour  Intake 7275.27 ml  Output 3425 ml  Net 3850.27 ml     Assessment/Plan:  67 y.o. 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

## 2023-08-14 NOTE — Progress Notes (Signed)
Patient transitioned to comfort care and terminally extubated at 1741. Patient's TOD August 21, 1805, pronounced by this RN and Reynold Bowen RN. Dr. Bedelia Person aware. Family at bedside.

## 2023-08-14 DEATH — deceased
